# Patient Record
Sex: Female | Born: 1965
Health system: Southern US, Community
[De-identification: ages and names within clinical notes are randomized; demographics above are authoritative.]

## PROBLEM LIST (undated history)

## (undated) DIAGNOSIS — D649 Anemia, unspecified: Secondary | ICD-10-CM

## (undated) DIAGNOSIS — R7301 Impaired fasting glucose: Secondary | ICD-10-CM

## (undated) DIAGNOSIS — I10 Essential (primary) hypertension: Secondary | ICD-10-CM

## (undated) DIAGNOSIS — E669 Obesity, unspecified: Secondary | ICD-10-CM

## (undated) DIAGNOSIS — M7989 Other specified soft tissue disorders: Secondary | ICD-10-CM

## (undated) DIAGNOSIS — N2581 Secondary hyperparathyroidism of renal origin: Secondary | ICD-10-CM

## (undated) DIAGNOSIS — E119 Type 2 diabetes mellitus without complications: Secondary | ICD-10-CM

## (undated) DIAGNOSIS — E559 Vitamin D deficiency, unspecified: Secondary | ICD-10-CM

## (undated) DIAGNOSIS — Z973 Presence of spectacles and contact lenses: Secondary | ICD-10-CM

## (undated) HISTORY — DX: Impaired fasting glucose: R73.01

## (undated) HISTORY — DX: Anemia, unspecified: D64.9

## (undated) HISTORY — DX: Obesity, unspecified: E66.9

## (undated) HISTORY — PX: BREAST BIOPSY: SHX20

## (undated) HISTORY — DX: Vitamin D deficiency, unspecified: E55.9

## (undated) HISTORY — DX: Essential (primary) hypertension: I10

## (undated) HISTORY — DX: Other specified soft tissue disorders: M79.89

## (undated) HISTORY — DX: Type 2 diabetes mellitus without complications: E11.9

## (undated) HISTORY — PX: REDUCTION MAMMAPLASTY: SUR839

## (undated) HISTORY — DX: Secondary hyperparathyroidism of renal origin: N25.81

## (undated) HISTORY — PX: TUBAL LIGATION: SHX77

---

## 2000-09-19 HISTORY — PX: GASTRIC BYPASS: SHX52

## 2008-02-04 ENCOUNTER — Ambulatory Visit: Payer: Self-pay | Admitting: Internal Medicine

## 2008-03-14 ENCOUNTER — Ambulatory Visit: Payer: Self-pay | Admitting: Internal Medicine

## 2008-03-18 LAB — CBC WITH DIFFERENTIAL/PLATELET
BASO%: 0 % (ref 0.0–2.0)
EOS%: 2.1 % (ref 0.0–7.0)
HGB: 12.5 g/dL (ref 11.6–15.9)
MCH: 27.7 pg (ref 26.0–34.0)
MCV: 83.8 fL (ref 81.0–101.0)
MONO%: 5.4 % (ref 0.0–13.0)
RBC: 4.5 10*6/uL (ref 3.70–5.32)
RDW: 28 % — ABNORMAL HIGH (ref 11.3–14.5)
lymph#: 1.5 10*3/uL (ref 0.9–3.3)

## 2008-03-18 LAB — IRON AND TIBC
Iron: 52 ug/dL (ref 42–145)
TIBC: 361 ug/dL (ref 250–470)
UIBC: 309 ug/dL

## 2008-06-09 ENCOUNTER — Ambulatory Visit: Payer: Self-pay | Admitting: Internal Medicine

## 2012-03-24 LAB — TSH: TSH: 1.17 u[IU]/mL (ref 0.41–5.90)

## 2012-03-24 LAB — LIPID PANEL
Cholesterol: 214 mg/dL — AB (ref 0–200)
HDL: 110 mg/dL — AB (ref 35–70)
LDL Cholesterol: 89 mg/dL

## 2012-03-24 LAB — BASIC METABOLIC PANEL
Potassium: 3.1 mmol/L — AB (ref 3.4–5.3)
Sodium: 139 mmol/L (ref 137–147)

## 2012-04-06 ENCOUNTER — Other Ambulatory Visit (HOSPITAL_BASED_OUTPATIENT_CLINIC_OR_DEPARTMENT_OTHER): Payer: Self-pay | Admitting: Family Medicine

## 2012-04-06 ENCOUNTER — Ambulatory Visit (HOSPITAL_BASED_OUTPATIENT_CLINIC_OR_DEPARTMENT_OTHER)
Admission: RE | Admit: 2012-04-06 | Discharge: 2012-04-06 | Disposition: A | Discharge status: Home / Self Care | Payer: 59 | Source: Ambulatory Visit | Attending: Family Medicine | Admitting: Family Medicine

## 2012-04-06 DIAGNOSIS — Z1231 Encounter for screening mammogram for malignant neoplasm of breast: Secondary | ICD-10-CM

## 2012-04-06 LAB — HM MAMMOGRAPHY: HM Mammogram: 0

## 2012-04-16 LAB — HM PAP SMEAR: HM Pap smear: NEGATIVE

## 2012-11-15 ENCOUNTER — Encounter: Payer: Self-pay | Admitting: *Deleted

## 2012-11-15 DIAGNOSIS — I10 Essential (primary) hypertension: Secondary | ICD-10-CM | POA: Insufficient documentation

## 2012-11-15 DIAGNOSIS — N2581 Secondary hyperparathyroidism of renal origin: Secondary | ICD-10-CM | POA: Insufficient documentation

## 2012-11-15 DIAGNOSIS — E559 Vitamin D deficiency, unspecified: Secondary | ICD-10-CM | POA: Insufficient documentation

## 2012-11-15 DIAGNOSIS — D649 Anemia, unspecified: Secondary | ICD-10-CM | POA: Insufficient documentation

## 2012-11-15 DIAGNOSIS — R7301 Impaired fasting glucose: Secondary | ICD-10-CM | POA: Insufficient documentation

## 2013-01-01 ENCOUNTER — Encounter: Payer: Self-pay | Admitting: *Deleted

## 2013-01-03 ENCOUNTER — Ambulatory Visit (INDEPENDENT_AMBULATORY_CARE_PROVIDER_SITE_OTHER): Payer: 59 | Admitting: Family Medicine

## 2013-01-03 ENCOUNTER — Encounter: Payer: Self-pay | Admitting: Family Medicine

## 2013-01-03 VITALS — BP 104/73 | HR 80 | Ht 61.0 in | Wt 168.0 lb

## 2013-01-03 DIAGNOSIS — F439 Reaction to severe stress, unspecified: Secondary | ICD-10-CM

## 2013-01-03 DIAGNOSIS — E669 Obesity, unspecified: Secondary | ICD-10-CM

## 2013-01-03 DIAGNOSIS — Z639 Problem related to primary support group, unspecified: Secondary | ICD-10-CM

## 2013-01-03 DIAGNOSIS — E119 Type 2 diabetes mellitus without complications: Secondary | ICD-10-CM

## 2013-01-03 DIAGNOSIS — I10 Essential (primary) hypertension: Secondary | ICD-10-CM

## 2013-01-03 MED ORDER — CANAGLIFLOZIN 300 MG PO TABS: 1.0000 | ORAL_TABLET | Freq: Every day | ORAL | Status: DC

## 2013-01-03 MED ORDER — PHENTERMINE-TOPIRAMATE ER 7.5-46 MG PO CP24: 1.0000 | ORAL_CAPSULE | Freq: Every day | ORAL | Status: DC

## 2013-01-03 MED ORDER — METFORMIN HCL 1000 MG PO TABS: 1000.0000 mg | ORAL_TABLET | Freq: Two times a day (BID) | ORAL | Status: DC

## 2013-01-03 NOTE — Progress Notes (Signed)
Subjective:     Patient ID: Heather Bryan, female   DOB: 1965-11-28, 47 y.o.   MRN: [id number]  HPI Heather Bryan is here today to discuss the conditions listed below.  1)  Type II DM:  She is currently taking a combination of Victoza, Invocana and metformin for her blood sugar.  She was recently informed that her insurance is no longer going to cover Victoza so she needs something else to replace it.     2)  Obesity:   She has been taking her Qsymia at least twice per week and has done well on it.  She has lost 14 lbs since starting on the Qsymia in December.    3)  Stress:  She continues to struggle with her youngest son Heather Bryan  He has been suspended from school and is being monitored by the court system for drug use.  He has to get drug testing every couple of weeks and has been clean for the past month.  Heather Bryan feels that now that he is 44 and he knows that he can go to jail that he may be starting to approach things a little more differently.    Review of Systems  Constitutional: Positive for appetite change (Qsymia has worked well for decreasing her appetite.  ). Negative for activity change.  Respiratory: Negative for chest tightness and shortness of breath.   Cardiovascular: Negative for chest pain, palpitations and leg swelling.  Endocrine: Negative for polydipsia, polyphagia and polyuria.  Neurological: Positive for light-headedness. Negative for weakness.  Psychiatric/Behavioral:       Her youngest son has had many issues which has caused her to have a lot of stress. He has been drug free for the past month which is helping to lessen her stress.     Past Medical History  Diagnosis Date  . Hypertension   . Anemia   . Secondary hyperparathyroidism   . Unspecified vitamin D deficiency   . Obesity, unspecified   . Type II diabetes mellitus    Family History  Problem Relation Age of Onset  . Diabetes Father   . Diabetes Maternal Aunt   . Diabetes Maternal Uncle   . Diabetes Paternal  Aunt   . Diabetes Paternal Uncle    History   Social History Narrative   Marital Status: Divorced   Children:  Sons (2) Bud/Demarco   Pets: No   Living Situation: Lives with her youngest son.     Occupation: Med Dietitian)    Education:  Associate's  Degree   Tobacco: Never smoker   Alcohol Use:  Rarely   Drug Use:  None   Diet:  Low Fat    Exercise:  Walking   Hobbies:  Reading, Cooking    [Tobacco: Never smoker]            Objective:   Physical Exam  Constitutional: She is oriented to person, place, and time. She appears well-nourished. No distress.  Eyes: Conjunctivae are normal. No scleral icterus.  Neck: Neck supple. No thyromegaly present.  Cardiovascular: Normal rate, regular rhythm and normal heart sounds.   Pulmonary/Chest: Effort normal and breath sounds normal.  Abdominal: She exhibits no distension. There is no tenderness.  Musculoskeletal: Normal range of motion.  Lymphadenopathy:    She has no cervical adenopathy.  Neurological: She is alert and oriented to person, place, and time.  Skin: Skin is warm and dry.  Psychiatric: She has a normal mood and affect. Her behavior is normal. Judgment  and thought content normal.       Assessment:     Type II Diabetes Obesity Hypertension     Plan:     1)   Since she has done so well on her diet, we are going to have her hold on a GLP-receptor agonist for now.  I assume that her insurance might cover Byetta vs Bydureon but we'll see how she does off of this medication as far as weight loss and sugar control are concerned.  We'll increase her Invokana from 100 mg to 300 mg and keep her on the metformin.  She will continue to work on diet and exercise. She was given an order to check her labs. She'll send [place] her results.      2)  Chelcie's  blood pressure is pretty low on 1/2 of the Diovan HCT (320/25). She is to monitor it and if it drops lower, she is to hold it.  She may be able to come off of it totally by  following the DASH diet.  If not, we may need to give her the 160/25 dosage to keep it in perfect range.    TIME 30 MINUTES:  MORE THAN 50 % OF TIME WAS INVOLVED IN COUNSELING.

## 2013-01-03 NOTE — Patient Instructions (Addendum)
1)  Blood Sugar - We're going to increase the Invokana to 300 mg and have you continue on the metformin.  Let's hold on the Bydureon (Victoza) for now.  We'll recheck in 3-6 your A1c.    2)  Blood Pressure - Continue on 1/2 of the Diovan as long as you don't feel dizzy/lightheaded.  If you do then hold.  If you notice your BP increases then we might consider a smaller dosage.    DASH Diet The DASH diet stands for "Dietary Approaches to Stop Hypertension." It is a healthy eating plan that has been shown to reduce high blood pressure (hypertension) in as little as 14 days, while also possibly providing other significant health benefits. These other health benefits include reducing the risk of breast cancer after menopause and reducing the risk of type 2 diabetes, heart disease, colon cancer, and stroke. Health benefits also include weight loss and slowing kidney failure in patients with chronic kidney disease.  DIET GUIDELINES  Limit salt (sodium). Your diet should contain less than 1500 mg of sodium daily.  Limit refined or processed carbohydrates. Your diet should include mostly whole grains. Desserts and added sugars should be used sparingly.  Include small amounts of heart-healthy fats. These types of fats include nuts, oils, and tub margarine. Limit saturated and trans fats. These fats have been shown to be harmful in the body. CHOOSING FOODS  The following food groups are based on a 2000 calorie diet. See your Registered Dietitian for individual calorie needs. Grains and Grain Products (6 to 8 servings daily)  Eat More Often: Whole-wheat bread, brown rice, whole-grain or wheat pasta, quinoa, popcorn without added fat or salt (air popped).  Eat Less Often: White bread, white pasta, white rice, cornbread. Vegetables (4 to 5 servings daily)  Eat More Often: Fresh, frozen, and canned vegetables. Vegetables may be raw, steamed, roasted, or grilled with a minimal amount of fat.  Eat Less  Often/Avoid: Creamed or fried vegetables. Vegetables in a cheese sauce. Fruit (4 to 5 servings daily)  Eat More Often: All fresh, canned (in natural juice), or frozen fruits. Dried fruits without added sugar. One hundred percent fruit juice ( cup [237 mL] daily).  Eat Less Often: Dried fruits with added sugar. Canned fruit in light or heavy syrup. [employer], Fish, and Poultry (2 servings or less daily. One serving is 3 to 4 oz [85-114 g]).  Eat More Often: Ninety percent or leaner ground beef, tenderloin, sirloin. Round cuts of beef, chicken breast, [place] breast. All fish. Grill, bake, or broil your meat. Nothing should be fried.  Eat Less Often/Avoid: Fatty cuts of meat, [place], or chicken leg, thigh, or wing. Fried cuts of meat or fish. Dairy (2 to 3 servings)  Eat More Often: Low-fat or fat-free milk, low-fat plain or light yogurt, reduced-fat or part-skim cheese.  Eat Less Often/Avoid: Milk (whole, 2%).Whole milk yogurt. Full-fat cheeses. Nuts, Seeds, and Legumes (4 to 5 servings per week)  Eat More Often: All without added salt.  Eat Less Often/Avoid: Salted nuts and seeds, canned beans with added salt. Fats and Sweets (limited)  Eat More Often: Vegetable oils, tub margarines without trans fats, sugar-free gelatin. Mayonnaise and salad dressings.  Eat Less Often/Avoid: Coconut oils, palm oils, butter, stick margarine, cream, half and half, cookies, candy, pie. FOR MORE INFORMATION The Dash Diet Eating Plan: www.dashdiet.org Document Released: 08/25/2011 Document Revised: 11/28/2011 Document Reviewed: 08/25/2011 [facility] Patient Information 2013 [place], [place].

## 2013-01-06 ENCOUNTER — Encounter: Payer: Self-pay | Admitting: Family Medicine

## 2013-01-06 DIAGNOSIS — F439 Reaction to severe stress, unspecified: Secondary | ICD-10-CM | POA: Insufficient documentation

## 2013-01-06 DIAGNOSIS — I10 Essential (primary) hypertension: Secondary | ICD-10-CM | POA: Insufficient documentation

## 2013-01-06 DIAGNOSIS — E119 Type 2 diabetes mellitus without complications: Secondary | ICD-10-CM | POA: Insufficient documentation

## 2013-01-07 ENCOUNTER — Encounter: Payer: Self-pay | Admitting: *Deleted

## 2013-01-10 ENCOUNTER — Other Ambulatory Visit: Payer: Self-pay | Admitting: Family Medicine

## 2013-01-11 LAB — COMPLETE METABOLIC PANEL WITH GFR
ALT: 10 U/L (ref 0–35)
AST: 16 U/L (ref 0–37)
Albumin: 4.4 g/dL (ref 3.5–5.2)
Alkaline Phosphatase: 57 U/L (ref 39–117)
BUN: 22 mg/dL (ref 6–23)
CO2: 33 mEq/L — ABNORMAL HIGH (ref 19–32)
Calcium: 10 mg/dL (ref 8.4–10.5)
Chloride: 99 mEq/L (ref 96–112)
Creat: 1.01 mg/dL (ref 0.50–1.10)
GFR, Est African American: 77 mL/min
GFR, Est Non African American: 67 mL/min
Glucose, Bld: 86 mg/dL (ref 70–99)
Potassium: 3.3 mEq/L — ABNORMAL LOW (ref 3.5–5.3)
Sodium: 140 mEq/L (ref 135–145)
Total Bilirubin: 0.5 mg/dL (ref 0.3–1.2)
Total Protein: 6.9 g/dL (ref 6.0–8.3)

## 2013-01-11 LAB — LIPID PANEL
Cholesterol: 178 mg/dL (ref 0–200)
HDL: 89 mg/dL (ref >39)
LDL Cholesterol: 74 mg/dL (ref 0–99)
Total CHOL/HDL Ratio: 2 Ratio
Triglycerides: 75 mg/dL (ref <150)
VLDL: 15 mg/dL (ref 0–40)

## 2013-01-11 LAB — HEMOGLOBIN A1C
Hgb A1c MFr Bld: 5.7 % — ABNORMAL HIGH (ref <5.7)
Mean Plasma Glucose: 117 mg/dL — ABNORMAL HIGH (ref <117)

## 2013-01-11 LAB — TSH: TSH: 0.712 u[IU]/mL (ref 0.350–4.500)

## 2013-01-11 LAB — VITAMIN D 25 HYDROXY (VIT D DEFICIENCY, FRACTURES): Vit D, 25-Hydroxy: 46 ng/mL (ref 30–89)

## 2013-01-12 LAB — MICROALBUMIN, URINE: Microalb, Ur: 0.5 mg/dL (ref 0.00–1.89)

## 2013-01-15 LAB — PTH, INTACT AND CALCIUM
Calcium, Total (PTH): 10.1 mg/dL (ref 8.4–10.5)
PTH: 141.7 pg/mL — ABNORMAL HIGH (ref 14.0–72.0)

## 2013-04-04 ENCOUNTER — Ambulatory Visit (INDEPENDENT_AMBULATORY_CARE_PROVIDER_SITE_OTHER): Payer: 59 | Admitting: Family Medicine

## 2013-04-04 ENCOUNTER — Encounter: Payer: Self-pay | Admitting: Family Medicine

## 2013-04-04 VITALS — BP 109/77 | HR 74 | Ht 61.0 in | Wt 164.0 lb

## 2013-04-04 DIAGNOSIS — N2581 Secondary hyperparathyroidism of renal origin: Secondary | ICD-10-CM

## 2013-04-04 DIAGNOSIS — E119 Type 2 diabetes mellitus without complications: Secondary | ICD-10-CM

## 2013-04-04 DIAGNOSIS — K649 Unspecified hemorrhoids: Secondary | ICD-10-CM

## 2013-04-04 DIAGNOSIS — E669 Obesity, unspecified: Secondary | ICD-10-CM

## 2013-04-04 LAB — POCT URINALYSIS DIPSTICK
Bilirubin, UA: NEGATIVE
Blood, UA: NEGATIVE
Glucose, UA: POSITIVE
Ketones, UA: NEGATIVE
Leukocytes, UA: NEGATIVE
Nitrite, UA: NEGATIVE
Protein, UA: NEGATIVE
Spec Grav, UA: 1.01
Urobilinogen, UA: NEGATIVE
pH, UA: 5

## 2013-04-04 MED ORDER — PHENTERMINE-TOPIRAMATE ER 7.5-46 MG PO CP24: 1.0000 | ORAL_CAPSULE | Freq: Every day | ORAL | Status: DC

## 2013-04-04 MED ORDER — CANAGLIFLOZIN 300 MG PO TABS: 1.0000 | ORAL_TABLET | Freq: Every day | ORAL | Status: DC

## 2013-04-04 NOTE — Progress Notes (Signed)
  Subjective:    Patient ID: Heather Bryan, female    DOB: [birth year], 47 y.o.   MRN: [id number]  HPI  Selena Batten is here today to discuss her lab results, get medication refills and the conditions listed below:     1)  IFG:  She has done well on the combination of Invokana and metformin and needs both medications refilled.   2)  Hemorrhoids:  Her GI doctor prescribed suppositories which have helped her.  She would like a refill on it.   3)  Weight:  She continues taking her Qsymia and has done well on it.     Review of Systems  Constitutional: Negative.   HENT: Negative.   Eyes: Negative.   Respiratory: Negative.   Cardiovascular: Negative.   Gastrointestinal: Negative.   Endocrine: Negative.   Genitourinary: Negative.   Musculoskeletal: Negative.   Skin: Negative.   Allergic/Immunologic: Negative.   Neurological: Negative.   Hematological: Negative.   Psychiatric/Behavioral: Negative.      Past Medical History  Diagnosis Date  . Hypertension   . Anemia   . Secondary hyperparathyroidism   . Unspecified vitamin D deficiency   . Obesity, unspecified   . Type II diabetes mellitus   . Impaired fasting glucose   . Swelling of limb      Family History  Problem Relation Age of Onset  . Diabetes Father   . Heart disease Father   . Hypertension Father   . Diabetes Maternal Aunt   . Diabetes Maternal Uncle   . Diabetes Paternal Aunt   . Diabetes Paternal Uncle   . Heart disease Mother   . Hypertension Mother      History   Social History Narrative   Marital Status: Divorced   Children:  Sons (2) Bud/Demarco   Pets: No   Living Situation: Lives with her youngest son.     Occupation: Med Dietitian)    Education:  Associate's  Degree   Tobacco: Never smoker   Alcohol Use:  Rarely   Drug Use:  None   Diet:  Low Fat    Exercise:  Walking   Hobbies:  Reading, Cooking    [Tobacco: Never smoker]              Objective:   Physical Exam   Constitutional: She appears well-nourished. No distress.  HENT:  Head: Normocephalic.  Eyes: No scleral icterus.  Neck: No thyromegaly present.  Cardiovascular: Normal rate, regular rhythm and normal heart sounds.   Pulmonary/Chest: Effort normal and breath sounds normal.  Abdominal: There is no tenderness.  Musculoskeletal: She exhibits no edema and no tenderness.  Neurological: She is alert.  Skin: Skin is warm and dry.  Psychiatric: She has a normal mood and affect. Her behavior is normal. Judgment and thought content normal.       Assessment & Plan:

## 2013-05-12 ENCOUNTER — Encounter: Payer: Self-pay | Admitting: Family Medicine

## 2013-05-12 DIAGNOSIS — N2581 Secondary hyperparathyroidism of renal origin: Secondary | ICD-10-CM | POA: Insufficient documentation

## 2013-05-12 DIAGNOSIS — K649 Unspecified hemorrhoids: Secondary | ICD-10-CM | POA: Insufficient documentation

## 2013-05-12 DIAGNOSIS — R7301 Impaired fasting glucose: Secondary | ICD-10-CM | POA: Insufficient documentation

## 2013-05-12 NOTE — Assessment & Plan Note (Signed)
Refilled her Invokana

## 2013-05-12 NOTE — Assessment & Plan Note (Signed)
Refilled her Qsymia.

## 2013-05-12 NOTE — Assessment & Plan Note (Addendum)
Her PTH remains elevated.  Her lab reports in the past showed that her picture was consistent with secondary hyperparathyroidism.  I will send her to an endocrinologist to see if she should have a hyperthyroid scan.

## 2013-05-12 NOTE — Assessment & Plan Note (Deleted)
Refilled her Invokana 

## 2013-05-12 NOTE — Assessment & Plan Note (Signed)
Refilled her hemorrhoidal cream.

## 2013-05-14 ENCOUNTER — Telehealth: Payer: Self-pay | Admitting: *Deleted

## 2013-05-14 NOTE — Telephone Encounter (Signed)
We faxed last office notes to Dr Corwin Levins (Endocrinologist).  After her records are reviewed his staff will contact Heather Bryan for an appointment.   Left a voice mail to Heather Bryan notifying her she will be contacted by Dr Michaelle Copas office soon. PG

## 2013-05-22 ENCOUNTER — Other Ambulatory Visit: Payer: Self-pay | Admitting: Endocrinology

## 2013-05-22 DIAGNOSIS — E213 Hyperparathyroidism, unspecified: Secondary | ICD-10-CM

## 2013-06-04 ENCOUNTER — Other Ambulatory Visit: Payer: 59

## 2013-06-05 ENCOUNTER — Ambulatory Visit
Admission: RE | Admit: 2013-06-05 | Discharge: 2013-06-05 | Disposition: A | Discharge status: Home / Self Care | Payer: 59 | Source: Ambulatory Visit | Attending: Endocrinology | Admitting: Endocrinology

## 2013-06-05 DIAGNOSIS — E213 Hyperparathyroidism, unspecified: Secondary | ICD-10-CM

## 2013-08-09 ENCOUNTER — Encounter: Payer: Self-pay | Admitting: *Deleted

## 2013-10-11 ENCOUNTER — Other Ambulatory Visit: Payer: Self-pay | Admitting: Family Medicine

## 2013-10-11 DIAGNOSIS — Z1231 Encounter for screening mammogram for malignant neoplasm of breast: Secondary | ICD-10-CM

## 2013-10-18 ENCOUNTER — Ambulatory Visit (HOSPITAL_BASED_OUTPATIENT_CLINIC_OR_DEPARTMENT_OTHER): Payer: 59

## 2013-10-23 ENCOUNTER — Ambulatory Visit (HOSPITAL_BASED_OUTPATIENT_CLINIC_OR_DEPARTMENT_OTHER)
Admission: RE | Admit: 2013-10-23 | Discharge: 2013-10-23 | Disposition: A | Discharge status: Home / Self Care | Payer: 59 | Source: Ambulatory Visit | Attending: Family Medicine | Admitting: Family Medicine

## 2013-10-23 DIAGNOSIS — Z1231 Encounter for screening mammogram for malignant neoplasm of breast: Secondary | ICD-10-CM | POA: Insufficient documentation

## 2013-10-31 ENCOUNTER — Other Ambulatory Visit: Payer: Self-pay | Admitting: Family Medicine

## 2013-10-31 DIAGNOSIS — R928 Other abnormal and inconclusive findings on diagnostic imaging of breast: Secondary | ICD-10-CM

## 2013-11-15 ENCOUNTER — Other Ambulatory Visit: Payer: 59

## 2013-11-19 ENCOUNTER — Other Ambulatory Visit: Payer: Self-pay | Admitting: *Deleted

## 2013-11-19 ENCOUNTER — Ambulatory Visit
Admission: RE | Admit: 2013-11-19 | Discharge: 2013-11-19 | Disposition: A | Discharge status: Home / Self Care | Payer: 59 | Source: Ambulatory Visit | Attending: Family Medicine | Admitting: Family Medicine

## 2013-11-19 ENCOUNTER — Other Ambulatory Visit: Payer: Self-pay | Admitting: Family Medicine

## 2013-11-19 DIAGNOSIS — E213 Hyperparathyroidism, unspecified: Secondary | ICD-10-CM

## 2013-11-19 DIAGNOSIS — R928 Other abnormal and inconclusive findings on diagnostic imaging of breast: Secondary | ICD-10-CM

## 2013-11-19 DIAGNOSIS — E119 Type 2 diabetes mellitus without complications: Secondary | ICD-10-CM

## 2013-11-19 DIAGNOSIS — R5383 Other fatigue: Secondary | ICD-10-CM

## 2013-11-19 DIAGNOSIS — R5381 Other malaise: Secondary | ICD-10-CM

## 2013-11-20 ENCOUNTER — Other Ambulatory Visit: Payer: 59

## 2013-11-26 ENCOUNTER — Other Ambulatory Visit: Payer: Self-pay | Admitting: Family Medicine

## 2013-11-26 ENCOUNTER — Ambulatory Visit
Admission: RE | Admit: 2013-11-26 | Discharge: 2013-11-26 | Disposition: A | Discharge status: Home / Self Care | Payer: 59 | Source: Ambulatory Visit | Attending: Family Medicine | Admitting: Family Medicine

## 2013-11-26 DIAGNOSIS — N6320 Unspecified lump in the left breast, unspecified quadrant: Secondary | ICD-10-CM

## 2013-12-06 LAB — CBC WITH DIFFERENTIAL/PLATELET

## 2013-12-06 LAB — COMPLETE METABOLIC PANEL WITH GFR
ALT: <8 U/L (ref 0–35)
AST: 18 U/L (ref 0–37)
Albumin: 4.7 g/dL (ref 3.5–5.2)
Alkaline Phosphatase: 70 U/L (ref 39–117)
BUN: 21 mg/dL (ref 6–23)
CO2: 29 mEq/L (ref 19–32)
Calcium: 10.3 mg/dL (ref 8.4–10.5)
Chloride: 101 mEq/L (ref 96–112)
Creat: 1.04 mg/dL (ref 0.50–1.10)
GFR, Est African American: 74 mL/min
GFR, Est Non African American: 64 mL/min
Glucose, Bld: 89 mg/dL (ref 70–99)
Potassium: 3.9 mEq/L (ref 3.5–5.3)
Sodium: 140 mEq/L (ref 135–145)
Total Bilirubin: 0.3 mg/dL (ref 0.2–1.2)
Total Protein: 7.2 g/dL (ref 6.0–8.3)

## 2013-12-06 LAB — PTH, INTACT AND CALCIUM
Calcium: 10.3 mg/dL (ref 8.4–10.5)
PTH: 239.7 pg/mL — ABNORMAL HIGH (ref 14.0–72.0)

## 2013-12-07 LAB — HEMOGLOBIN A1C
Hgb A1c MFr Bld: 5.7 % — ABNORMAL HIGH (ref <5.7)
Mean Plasma Glucose: 117 mg/dL — ABNORMAL HIGH (ref <117)

## 2013-12-09 ENCOUNTER — Encounter: Payer: Self-pay | Admitting: Family Medicine

## 2013-12-09 ENCOUNTER — Ambulatory Visit: Payer: 59 | Admitting: Family Medicine

## 2013-12-09 ENCOUNTER — Ambulatory Visit (INDEPENDENT_AMBULATORY_CARE_PROVIDER_SITE_OTHER): Payer: 59 | Admitting: Family Medicine

## 2013-12-09 ENCOUNTER — Encounter (INDEPENDENT_AMBULATORY_CARE_PROVIDER_SITE_OTHER): Payer: Self-pay

## 2013-12-09 VITALS — BP 112/76 | HR 70 | Resp 16 | Ht 61.0 in | Wt 157.0 lb

## 2013-12-09 DIAGNOSIS — E669 Obesity, unspecified: Secondary | ICD-10-CM

## 2013-12-09 DIAGNOSIS — K649 Unspecified hemorrhoids: Secondary | ICD-10-CM

## 2013-12-09 DIAGNOSIS — Z01818 Encounter for other preprocedural examination: Secondary | ICD-10-CM

## 2013-12-09 DIAGNOSIS — E559 Vitamin D deficiency, unspecified: Secondary | ICD-10-CM

## 2013-12-09 DIAGNOSIS — M7989 Other specified soft tissue disorders: Secondary | ICD-10-CM

## 2013-12-09 DIAGNOSIS — E119 Type 2 diabetes mellitus without complications: Secondary | ICD-10-CM

## 2013-12-09 DIAGNOSIS — I10 Essential (primary) hypertension: Secondary | ICD-10-CM

## 2013-12-09 MED ORDER — PHENTERMINE-TOPIRAMATE ER 7.5-46 MG PO CP24
1.0000 | ORAL_CAPSULE | Freq: Every day | ORAL | Status: AC
Start: 2013-12-09 — End: 2014-12-09

## 2013-12-09 MED ORDER — VITAMIN D (ERGOCALCIFEROL) 1.25 MG (50000 UNIT) PO CAPS: 50000.0000 [IU] | ORAL_CAPSULE | ORAL | Status: DC

## 2013-12-09 MED ORDER — FUROSEMIDE 40 MG PO TABS: ORAL_TABLET | ORAL | Status: DC

## 2013-12-09 MED ORDER — METFORMIN HCL 1000 MG PO TABS: 1000.0000 mg | ORAL_TABLET | Freq: Two times a day (BID) | ORAL | Status: DC

## 2013-12-09 MED ORDER — CANAGLIFLOZIN 300 MG PO TABS: 1.0000 | ORAL_TABLET | Freq: Every day | ORAL | Status: AC

## 2013-12-09 MED ORDER — VALSARTAN-HYDROCHLOROTHIAZIDE 320-25 MG PO TABS: 1.0000 | ORAL_TABLET | Freq: Every day | ORAL | Status: DC

## 2013-12-11 ENCOUNTER — Encounter (HOSPITAL_BASED_OUTPATIENT_CLINIC_OR_DEPARTMENT_OTHER): Payer: Self-pay | Admitting: *Deleted

## 2013-12-11 MED ORDER — HYDROCORTISONE ACETATE 25 MG RE SUPP: 25.0000 mg | Freq: Two times a day (BID) | RECTAL | Status: AC | PRN

## 2013-12-11 NOTE — Progress Notes (Signed)
Pt works [employer]=-3rd shift-is working night before surgery-told her to hydrate well-bring all meds-do not take any diabetes meds am surgery

## 2013-12-17 ENCOUNTER — Encounter (HOSPITAL_BASED_OUTPATIENT_CLINIC_OR_DEPARTMENT_OTHER): Payer: Self-pay | Admitting: Certified Registered"

## 2013-12-17 ENCOUNTER — Ambulatory Visit (HOSPITAL_BASED_OUTPATIENT_CLINIC_OR_DEPARTMENT_OTHER)
Admission: RE | Admit: 2013-12-17 | Discharge: 2013-12-17 | Disposition: A | Discharge status: Home / Self Care | Payer: 59 | Source: Ambulatory Visit | Attending: Plastic Surgery | Admitting: Plastic Surgery

## 2013-12-17 ENCOUNTER — Ambulatory Visit (HOSPITAL_BASED_OUTPATIENT_CLINIC_OR_DEPARTMENT_OTHER): Payer: 59 | Admitting: Certified Registered"

## 2013-12-17 ENCOUNTER — Encounter (HOSPITAL_BASED_OUTPATIENT_CLINIC_OR_DEPARTMENT_OTHER)
Admission: RE | Disposition: A | Discharge status: Home / Self Care | Payer: Self-pay | Source: Ambulatory Visit | Attending: Plastic Surgery

## 2013-12-17 ENCOUNTER — Encounter (HOSPITAL_BASED_OUTPATIENT_CLINIC_OR_DEPARTMENT_OTHER): Payer: 59 | Admitting: Certified Registered"

## 2013-12-17 DIAGNOSIS — N62 Hypertrophy of breast: Secondary | ICD-10-CM | POA: Insufficient documentation

## 2013-12-17 DIAGNOSIS — E119 Type 2 diabetes mellitus without complications: Secondary | ICD-10-CM | POA: Insufficient documentation

## 2013-12-17 DIAGNOSIS — Z9884 Bariatric surgery status: Secondary | ICD-10-CM | POA: Insufficient documentation

## 2013-12-17 DIAGNOSIS — I10 Essential (primary) hypertension: Secondary | ICD-10-CM | POA: Insufficient documentation

## 2013-12-17 HISTORY — DX: Presence of spectacles and contact lenses: Z97.3

## 2013-12-17 HISTORY — PX: BREAST REDUCTION SURGERY: SHX8

## 2013-12-17 HISTORY — PX: BREAST SURGERY: SHX581

## 2013-12-17 LAB — POCT HEMOGLOBIN-HEMACUE: Hemoglobin: 12.8 g/dL (ref 12.0–15.0)

## 2013-12-17 LAB — GLUCOSE, CAPILLARY
GLUCOSE-CAPILLARY: 75 mg/dL (ref 70–99)
Glucose-Capillary: 138 mg/dL — ABNORMAL HIGH (ref 70–99)
Glucose-Capillary: 152 mg/dL — ABNORMAL HIGH (ref 70–99)

## 2013-12-17 SURGERY — MAMMOPLASTY, REDUCTION
Anesthesia: General | Site: Breast | Laterality: Bilateral

## 2013-12-17 MED ORDER — ONDANSETRON HCL 4 MG/2ML IJ SOLN
INTRAMUSCULAR | Status: DC | PRN
  Administered 2013-12-17: 4 mg via INTRAVENOUS

## 2013-12-17 MED ORDER — MIDAZOLAM HCL 2 MG/2ML IJ SOLN
INTRAMUSCULAR | Status: AC
  Filled 2013-12-17: qty 2

## 2013-12-17 MED ORDER — LIDOCAINE-EPINEPHRINE 1 %-1:100000 IJ SOLN
INTRAMUSCULAR | Status: DC | PRN
  Administered 2013-12-17: 40 mL

## 2013-12-17 MED ORDER — BUPIVACAINE LIPOSOME 1.3 % IJ SUSP
INTRAMUSCULAR | Status: AC
  Filled 2013-12-17: qty 40

## 2013-12-17 MED ORDER — CEFAZOLIN SODIUM-DEXTROSE 2-3 GM-% IV SOLR
INTRAVENOUS | Status: AC
  Filled 2013-12-17: qty 50

## 2013-12-17 MED ORDER — MIDAZOLAM HCL 2 MG/2ML IJ SOLN: 1.0000 mg | INTRAMUSCULAR | Status: DC | PRN

## 2013-12-17 MED ORDER — FENTANYL CITRATE 0.05 MG/ML IJ SOLN
INTRAMUSCULAR | Status: DC | PRN
  Administered 2013-12-17 (×2): 50 ug via INTRAVENOUS
  Administered 2013-12-17 (×3): 100 ug via INTRAVENOUS
  Administered 2013-12-17 (×3): 50 ug via INTRAVENOUS

## 2013-12-17 MED ORDER — BUPIVACAINE LIPOSOME 1.3 % IJ SUSP
INTRAMUSCULAR | Status: DC | PRN
  Administered 2013-12-17: 20 mL

## 2013-12-17 MED ORDER — OXYCODONE HCL 5 MG PO TABS
ORAL_TABLET | ORAL | Status: AC
  Filled 2013-12-17: qty 1

## 2013-12-17 MED ORDER — HYDROMORPHONE HCL PF 1 MG/ML IJ SOLN
0.2500 mg | INTRAMUSCULAR | Status: DC | PRN
  Administered 2013-12-17 (×2): 0.5 mg via INTRAVENOUS

## 2013-12-17 MED ORDER — OXYCODONE HCL 5 MG PO TABS
5.0000 mg | ORAL_TABLET | Freq: Once | ORAL | Status: AC | PRN
  Administered 2013-12-17: 5 mg via ORAL

## 2013-12-17 MED ORDER — FENTANYL CITRATE 0.05 MG/ML IJ SOLN: 50.0000 ug | INTRAMUSCULAR | Status: DC | PRN

## 2013-12-17 MED ORDER — LIDOCAINE HCL 1 % IJ SOLN
INTRAVENOUS | Status: DC | PRN
  Administered 2013-12-17: 10:00:00

## 2013-12-17 MED ORDER — MEPERIDINE HCL 25 MG/ML IJ SOLN: 6.2500 mg | INTRAMUSCULAR | Status: DC | PRN

## 2013-12-17 MED ORDER — PROPOFOL 10 MG/ML IV EMUL
INTRAVENOUS | Status: AC
  Filled 2013-12-17: qty 50

## 2013-12-17 MED ORDER — LIDOCAINE HCL (CARDIAC) 20 MG/ML IV SOLN
INTRAVENOUS | Status: DC | PRN
  Administered 2013-12-17: 60 mg via INTRAVENOUS

## 2013-12-17 MED ORDER — LIDOCAINE HCL 1 % IJ SOLN
INTRAVENOUS | Status: DC | PRN
  Administered 2013-12-17: 09:00:00

## 2013-12-17 MED ORDER — SODIUM CHLORIDE 0.9 % IJ SOLN
INTRAMUSCULAR | Status: AC
Start: 2013-12-17 — End: 2013-12-17
  Filled 2013-12-17: qty 40

## 2013-12-17 MED ORDER — BACITRACIN ZINC 500 UNIT/GM EX OINT
TOPICAL_OINTMENT | CUTANEOUS | Status: AC
  Filled 2013-12-17: qty 28.35

## 2013-12-17 MED ORDER — EPHEDRINE SULFATE 50 MG/ML IJ SOLN
INTRAMUSCULAR | Status: DC | PRN
  Administered 2013-12-17 (×4): 10 mg via INTRAVENOUS
  Administered 2013-12-17 (×2): 5 mg via INTRAVENOUS
  Administered 2013-12-17: 10 mg via INTRAVENOUS

## 2013-12-17 MED ORDER — FENTANYL CITRATE 0.05 MG/ML IJ SOLN
INTRAMUSCULAR | Status: AC
  Filled 2013-12-17: qty 12

## 2013-12-17 MED ORDER — BACITRACIN ZINC 500 UNIT/GM EX OINT
TOPICAL_OINTMENT | CUTANEOUS | Status: DC | PRN
  Administered 2013-12-17: 1 via TOPICAL

## 2013-12-17 MED ORDER — EPINEPHRINE HCL 1 MG/ML IJ SOLN
INTRAMUSCULAR | Status: AC
  Filled 2013-12-17: qty 1

## 2013-12-17 MED ORDER — MIDAZOLAM HCL 2 MG/2ML IJ SOLN: 0.5000 mg | Freq: Once | INTRAMUSCULAR | Status: DC | PRN

## 2013-12-17 MED ORDER — CEFAZOLIN SODIUM 1-5 GM-% IV SOLN
1.0000 g | Freq: Once | INTRAVENOUS | Status: AC
  Administered 2013-12-17: 2 g via INTRAVENOUS

## 2013-12-17 MED ORDER — LACTATED RINGERS IV SOLN
INTRAVENOUS | Status: DC
  Administered 2013-12-17 (×4): via INTRAVENOUS

## 2013-12-17 MED ORDER — MIDAZOLAM HCL 5 MG/5ML IJ SOLN
INTRAMUSCULAR | Status: DC | PRN
  Administered 2013-12-17: 2 mg via INTRAVENOUS

## 2013-12-17 MED ORDER — PROMETHAZINE HCL 25 MG/ML IJ SOLN: 6.2500 mg | INTRAMUSCULAR | Status: DC | PRN

## 2013-12-17 MED ORDER — DEXAMETHASONE SODIUM PHOSPHATE 4 MG/ML IJ SOLN
INTRAMUSCULAR | Status: DC | PRN
Start: 2013-12-17 — End: 2013-12-17
  Administered 2013-12-17: 4 mg via INTRAVENOUS

## 2013-12-17 MED ORDER — SUCCINYLCHOLINE CHLORIDE 20 MG/ML IJ SOLN
INTRAMUSCULAR | Status: DC | PRN
Start: 2013-12-17 — End: 2013-12-17
  Administered 2013-12-17: 100 mg via INTRAVENOUS

## 2013-12-17 MED ORDER — LIDOCAINE-EPINEPHRINE 1 %-1:100000 IJ SOLN
INTRAMUSCULAR | Status: AC
  Filled 2013-12-17: qty 2

## 2013-12-17 MED ORDER — LIDOCAINE HCL (PF) 1 % IJ SOLN
INTRAMUSCULAR | Status: AC
  Filled 2013-12-17: qty 30

## 2013-12-17 MED ORDER — OXYCODONE HCL 5 MG/5ML PO SOLN: 5.0000 mg | Freq: Once | ORAL | Status: AC | PRN

## 2013-12-17 MED ORDER — HYDROMORPHONE HCL PF 1 MG/ML IJ SOLN
INTRAMUSCULAR | Status: AC
  Filled 2013-12-17: qty 1

## 2013-12-17 MED ORDER — PROPOFOL 10 MG/ML IV BOLUS
INTRAVENOUS | Status: DC | PRN
Start: 2013-12-17 — End: 2013-12-17
  Administered 2013-12-17: 200 mg via INTRAVENOUS
  Administered 2013-12-17: 50 mg via INTRAVENOUS

## 2013-12-17 SURGICAL SUPPLY — 67 items
BAG DECANTER FOR FLEXI CONT (MISCELLANEOUS) ×6 IMPLANT
BENZOIN TINCTURE PRP APPL 2/3 (GAUZE/BANDAGES/DRESSINGS) ×6 IMPLANT
BLADE KNIFE PERSONA 10 (BLADE) ×12 IMPLANT
BLADE KNIFE PERSONA 15 (BLADE) ×9 IMPLANT
BNDG GAUZE ELAST 4 BULKY (GAUZE/BANDAGES/DRESSINGS) ×6 IMPLANT
CANISTER SUCT 1200ML W/VALVE (MISCELLANEOUS) ×6 IMPLANT
CAP BOUFFANT 24 BLUE NURSES (PROTECTIVE WEAR) ×3 IMPLANT
CLOSURE WOUND 1/2 X4 (GAUZE/BANDAGES/DRESSINGS) ×4
COVER MAYO STAND STRL (DRAPES) ×3 IMPLANT
COVER TABLE BACK 60X90 (DRAPES) ×3 IMPLANT
DECANTER SPIKE VIAL GLASS SM (MISCELLANEOUS) IMPLANT
DRAIN CHANNEL 10F 3/8 F FF (DRAIN) ×6 IMPLANT
DRAPE LAPAROSCOPIC ABDOMINAL (DRAPES) ×3 IMPLANT
DRSG EMULSION OIL 3X3 NADH (GAUZE/BANDAGES/DRESSINGS) ×6 IMPLANT
DRSG PAD ABDOMINAL 8X10 ST (GAUZE/BANDAGES/DRESSINGS) ×12 IMPLANT
ELECT REM PT RETURN 9FT ADLT (ELECTROSURGICAL) ×3
ELECTRODE REM PT RTRN 9FT ADLT (ELECTROSURGICAL) ×1 IMPLANT
EVACUATOR SILICONE 100CC (DRAIN) ×6 IMPLANT
FILTER 7/8 IN (FILTER) ×3 IMPLANT
GLOVE BIO SURGEON STRL SZ7 (GLOVE) ×6 IMPLANT
GLOVE BIOGEL PI IND STRL 7.0 (GLOVE) ×2 IMPLANT
GLOVE BIOGEL PI INDICATOR 7.0 (GLOVE) ×4
GLOVE ECLIPSE 6.5 STRL STRAW (GLOVE) ×12 IMPLANT
GOWN STRL REUS W/ TWL LRG LVL3 (GOWN DISPOSABLE) ×3 IMPLANT
GOWN STRL REUS W/ TWL XL LVL3 (GOWN DISPOSABLE) ×1 IMPLANT
GOWN STRL REUS W/TWL LRG LVL3 (GOWN DISPOSABLE) ×6
GOWN STRL REUS W/TWL XL LVL3 (GOWN DISPOSABLE) ×2
LINER CANISTER 1000CC FLEX (MISCELLANEOUS) ×3 IMPLANT
NDL SAFETY ECLIPSE 18X1.5 (NEEDLE) ×2 IMPLANT
NEEDLE HYPO 18GX1.5 SHARP (NEEDLE) ×4
NEEDLE HYPO 25X1 1.5 SAFETY (NEEDLE) ×6 IMPLANT
NEEDLE SPNL 18GX3.5 QUINCKE PK (NEEDLE) ×3 IMPLANT
NS IRRIG 1000ML POUR BTL (IV SOLUTION) ×6 IMPLANT
PACK BASIN DAY SURGERY FS (CUSTOM PROCEDURE TRAY) ×3 IMPLANT
PIN SAFETY STERILE (MISCELLANEOUS) ×3 IMPLANT
SCRUB PCMX 4 OZ (MISCELLANEOUS) ×3 IMPLANT
SCRUB TECHNI CARE SURGICAL (MISCELLANEOUS) IMPLANT
SHEET MEDIUM DRAPE 40X70 STRL (DRAPES) ×6 IMPLANT
SLEEVE SCD COMPRESS KNEE MED (MISCELLANEOUS) ×3 IMPLANT
SPECIMEN JAR MEDIUM (MISCELLANEOUS) ×6 IMPLANT
SPECIMEN JAR X LARGE (MISCELLANEOUS) IMPLANT
SPONGE GAUZE 4X4 12PLY (GAUZE/BANDAGES/DRESSINGS) ×6 IMPLANT
SPONGE LAP 18X18 X RAY DECT (DISPOSABLE) ×9 IMPLANT
STAPLER VISISTAT 35W (STAPLE) ×6 IMPLANT
STRIP CLOSURE SKIN 1/2X4 (GAUZE/BANDAGES/DRESSINGS) ×8 IMPLANT
SUT ETHILON 3 0 PS 1 (SUTURE) ×3 IMPLANT
SUT MNCRL AB 3-0 PS2 18 (SUTURE) ×15 IMPLANT
SUT MNCRL AB 4-0 PS2 18 (SUTURE) ×6 IMPLANT
SUT MON AB 5-0 PS2 18 (SUTURE) ×12 IMPLANT
SUT PROLENE 2 0 CT2 30 (SUTURE) ×3 IMPLANT
SUT PROLENE 3 0 PS 1 (SUTURE) ×6 IMPLANT
SUT QUILL PDO 2-0 (SUTURE) ×6 IMPLANT
SYR 20CC LL (SYRINGE) ×3 IMPLANT
SYR 50ML LL SCALE MARK (SYRINGE) ×6 IMPLANT
SYR 5ML LL (SYRINGE) ×3 IMPLANT
SYR BULB IRRIGATION 50ML (SYRINGE) ×6 IMPLANT
SYR CONTROL 10ML LL (SYRINGE) ×9 IMPLANT
TOWEL OR 17X24 6PK STRL BLUE (TOWEL DISPOSABLE) ×9 IMPLANT
TOWEL OR NON WOVEN STRL DISP B (DISPOSABLE) IMPLANT
TRAY DSU PREP LF (CUSTOM PROCEDURE TRAY) ×3 IMPLANT
TRAY FOLEY CATH 14FR (SET/KITS/TRAYS/PACK) ×3 IMPLANT
TUBE CONNECTING 20'X1/4 (TUBING) ×1
TUBE CONNECTING 20X1/4 (TUBING) ×2 IMPLANT
TUBING SET GRADUATE ASPIR 12FT (MISCELLANEOUS) ×3 IMPLANT
UNDERPAD 30X30 INCONTINENT (UNDERPADS AND DIAPERS) ×6 IMPLANT
VAC PENCILS W/TUBING CLEAR (MISCELLANEOUS) ×3 IMPLANT
YANKAUER SUCT BULB TIP NO VENT (SUCTIONS) ×3 IMPLANT

## 2013-12-17 NOTE — Brief Op Note (Signed)
12/17/2013  12:18 PM  PATIENT:  Elayne GuerinKim Wilder  48 y.o. female  PRE-OPERATIVE DIAGNOSIS:  Hypertrophy of breast  POST-OPERATIVE DIAGNOSIS:  Hypertrophy of breast  PROCEDURE:  Bilateral Reduction Mammaplasties  SURGEON:  Surgeon(s) and Role:    * Mary A Contogiannis, MD - Primary  ANESTHESIA:   general  EBL:  Total I/O In: 3000 [I.V.:3000] Out: 1050 [Urine:800; Blood:250]  BLOOD ADMINISTERED:none  DRAINS: (35F) Jackson-Pratt drain(s) with closed bulb suction in the Bilateral breasts   LOCAL MEDICATIONS USED:  OTHER 1.3% Exparel (total 266 mgs.)  SPECIMEN:  Source of Specimen:  Bilateral Breasts  DISPOSITION OF SPECIMEN:  PATHOLOGY  COUNTS:  YES  DICTATION: .Other Dictation: Dictation Number [id number]  PLAN OF CARE: Discharge to home after PACU  PATIENT DISPOSITION:  PACU - hemodynamically stable.   Delay start of Pharmacological VTE agent (>24hrs) due to surgical blood loss or risk of bleeding: not applicable

## 2013-12-17 NOTE — Discharge Instructions (Signed)
1. No lifting greater than 5 lbs with arms for 4 weeks. 2. Empty, strip, record and reactivate JP drains 3 times a day. 3. Percocet 5/325 mg tabs 1-2 tabs po q 4-6 hours prn pain- prescription given in office. 4. Duricef 1 tab po bid- prescription given in office. 5. Sterapred dose pack as directed- prescription given in office. 6. Follow-up appointment Thursday in office.   Post Anesthesia Home Care Instructions  Activity: Get plenty of rest for the remainder of the day. A responsible adult should stay with you for 24 hours following the procedure.  For the next 24 hours, DO NOT: -Drive a car -Advertising copywriterperate machinery -Drink alcoholic beverages -Take any medication unless instructed by your physician -Make any legal decisions or sign important papers.  Meals: Start with liquid foods such as gelatin or soup. Progress to regular foods as tolerated. Avoid greasy, spicy, heavy foods. If nausea and/or vomiting occur, drink only clear liquids until the nausea and/or vomiting subsides. Call your physician if vomiting continues.  Special Instructions/Symptoms: Your throat may feel dry or sore from the anesthesia or the breathing tube placed in your throat during surgery. If this causes discomfort, gargle with warm salt water. The discomfort should disappear within 24 hours.  About my Jackson-Pratt Bulb Drain  What is a Jackson-Pratt bulb? A Jackson-Pratt is a soft, round device used to collect drainage. It is connected to a long, thin drainage catheter, which is held in place by one or two small stiches near your surgical incision site. When the bulb is squeezed, it forms a vacuum, forcing the drainage to empty into the bulb.  Emptying the Jackson-Pratt bulb- To empty the bulb: 1. Release the plug on the top of the bulb. 2. Pour the bulb's contents into a measuring container which your nurse will provide. 3. Record the time emptied and amount of drainage. Empty the drain(s) as often as your      doctor or nurse recommends.  Date                  Time                    Amount (Drain 1)                 Amount (Drain 2)  _____________________________________________________________________  _____________________________________________________________________  _____________________________________________________________________  _____________________________________________________________________  _____________________________________________________________________  _____________________________________________________________________  _____________________________________________________________________  _____________________________________________________________________  Squeezing the Jackson-Pratt Bulb- To squeeze the bulb: 1. Make sure the plug at the top of the bulb is open. 2. Squeeze the bulb tightly in your fist. You will hear air squeezing from the bulb. 3. Replace the plug while the bulb is squeezed. 4. Use a safety pin to attach the bulb to your clothing. This will keep the catheter from     pulling at the bulb insertion site.  When to call your doctor- Call your doctor if:  Drain site becomes red, swollen or hot.  You have a fever greater than 101 degrees F.  There is oozing at the drain site.  Drain falls out (apply a guaze bandage over the drain hole and secure it with tape).  Drainage increases daily not related to activity patterns. (You will usually have more drainage when you are active than when you are resting.)  Drainage has a bad odor.   SACRAL DRESSING (Lower Back)    A pressure ulcer is a sore where the skin breaks open    This dressing will be placed on  your lower back to protect this area from pressure and moisture and in many cases helps prevent pressure ulcers from forming    A nurse may place this dressing before your surgery or another procedure    A nurse may also place this dressing if you have other conditions that put  you at risk for developing a pressure ulcer    If you are getting up and moving around after surgery, the dressing may be taken off with your first shower. Simply remove it and throw it away.    While you are in the hospital, nurses will change the dressing twice a week as long as you are still at risk for developing a pressure ulcer    This dressing is latex free and made with silicone (for adhesive sensitivity) so it is safe and gentle to the skin   Information for Discharge Teaching: EXPAREL (bupivacaine liposome injectable suspension)   Your surgeon gave you EXPAREL(bupivacaine) in your surgical incision to help control your pain after surgery.   EXPAREL is a local anesthetic that provides pain relief by numbing the tissue around the surgical site.  EXPAREL is designed to release pain medication over time and can control pain for up to 72 hours.  Depending on how you respond to EXPAREL, you may require less pain medication during your recovery.  Possible side effects:  Temporary loss of sensation or ability to move in the area where bupivacaine was injected.  Nausea, vomiting, constipation  Rarely, numbness and tingling in your mouth or lips, lightheadedness, or anxiety may occur.  Call your doctor right away if you think you may be experiencing any of these sensations, or if you have other questions regarding possible side effects.  Follow all other discharge instructions given to you by your surgeon or nurse. Eat a healthy diet and drink plenty of water or other fluids.  If you return to the hospital for any reason within 96 hours following the administration of EXPAREL, please inform your health care providers.

## 2013-12-17 NOTE — Anesthesia Procedure Notes (Signed)
Procedure Name: Intubation Date/Time: 12/17/2013 7:58 AM Performed by: Braniyah Besse Pre-anesthesia Checklist: Patient identified, Emergency Drugs available, Suction available and Patient being monitored Patient Re-evaluated:Patient Re-evaluated prior to inductionOxygen Delivery Method: Circle System Utilized Preoxygenation: Pre-oxygenation with 100% oxygen Intubation Type: IV induction Ventilation: Mask ventilation without difficulty Laryngoscope Size: Mac and 3 Grade View: Grade I Tube type: Oral Number of attempts: 1 Airway Equipment and Method: stylet and LTA kit utilized Placement Confirmation: ETT inserted through vocal cords under direct vision,  positive ETCO2 and breath sounds checked- equal and bilateral Secured at: 21 cm Tube secured with: Tape Dental Injury: Teeth and Oropharynx as per pre-operative assessment

## 2013-12-17 NOTE — Anesthesia Preprocedure Evaluation (Addendum)
Anesthesia Evaluation  Patient identified by MRN, date of birth, ID band Patient awake    Reviewed: Allergy & Precautions, H&P , NPO status , Patient's Chart, lab work & pertinent test results  History of Anesthesia Complications Negative for: history of anesthetic complications  Airway Mallampati: I TM Distance: >3 FB Neck ROM: Full    Dental  (+) Teeth Intact, Dental Advisory Given   Pulmonary neg pulmonary ROS,  breath sounds clear to auscultation  Pulmonary exam normal       Cardiovascular hypertension, Pt. on medications Rhythm:Regular Rate:Normal     Neuro/Psych negative neurological ROS     GI/Hepatic negative GI ROS, Neg liver ROS, S/p gastric bypass   Endo/Other  diabetes, Type 2, Oral Hypoglycemic AgentsMorbid obesity  Renal/GU negative Renal ROS     Musculoskeletal   Abdominal (+) + obese,   Peds  Hematology  (+) anemia ,   Anesthesia Other Findings   Reproductive/Obstetrics S/p BTL                         Anesthesia Physical Anesthesia Plan  ASA: II  Anesthesia Plan: General   Post-op Pain Management:    Induction: Intravenous  Airway Management Planned: Oral ETT  Additional Equipment:   Intra-op Plan:   Post-operative Plan: Extubation in OR  Informed Consent: I have reviewed the patients History and Physical, chart, labs and discussed the procedure including the risks, benefits and alternatives for the proposed anesthesia with the patient or authorized representative who has indicated his/her understanding and acceptance.   Dental advisory given  Plan Discussed with: CRNA and Surgeon  Anesthesia Plan Comments: (Plan routine monitors, GETA)        Anesthesia Quick Evaluation

## 2013-12-17 NOTE — Anesthesia Postprocedure Evaluation (Signed)
  Anesthesia Post-op Note  Patient: Heather Bryan  Procedure(s) Performed: Procedure(s): MAMMARY REDUCTION  (BREAST) BILATERAL WITH LIPOSUCTION (Bilateral)  Patient Location: PACU  Anesthesia Type:General  Level of Consciousness: awake, alert , oriented and patient cooperative  Airway and Oxygen Therapy: Patient Spontanous Breathing  Post-op Pain: mild  Post-op Assessment: Post-op Vital signs reviewed, Patient's Cardiovascular Status Stable, Respiratory Function Stable, Patent Airway, No signs of Nausea or vomiting and Pain level controlled  Post-op Vital Signs: Reviewed and stable  Complications: No apparent anesthesia complications

## 2013-12-17 NOTE — Transfer of Care (Signed)
Immediate Anesthesia Transfer of Care Note  Patient: Heather Bryan  Procedure(s) Performed: Procedure(s): MAMMARY REDUCTION  (BREAST) BILATERAL WITH LIPOSUCTION (Bilateral)  Patient Location: PACU  Anesthesia Type:General  Level of Consciousness: awake, alert , oriented and patient cooperative  Airway & Oxygen Therapy: Patient Spontanous Breathing and Patient connected to face mask oxygen  Post-op Assessment: Report given to PACU RN and Post -op Vital signs reviewed and stable  Post vital signs: Reviewed and stable  Complications: No apparent anesthesia complications

## 2013-12-18 ENCOUNTER — Encounter (HOSPITAL_BASED_OUTPATIENT_CLINIC_OR_DEPARTMENT_OTHER): Payer: Self-pay | Admitting: Plastic Surgery

## 2013-12-18 NOTE — Progress Notes (Signed)
Subjective:    Patient ID: Heather GuerinKim Bryan, female    DOB: 02-12-1966, 48 y.o.   MRN: [id number]  HPI  Heather BattenKim is here today to go over her most recent lab results and to discuss the conditions listed below:   1)  Surgical Clearance:  She is scheduled for breast reduction surgery with Dr.Contogiannis and needs an EKG and a surgical clearance letter.    2)  Hemorrhoids:  She continues to struggle with this problem.  She has used her Anusol suppositories and would like another prescription for it.   3)  Type II DM:  She needs a refill for Invokana and metformin.    4)  Hypertension:  Her BP is controlled on valsartan-HCT.    5)  Vitamin D Deficiency:  She needs a refill of her Vitamin D.    6)  Weight:  She has done well on Qsymia and would like to continue on it.     Review of Systems  Constitutional: Negative for activity change, fatigue and unexpected weight change.  HENT: Negative.   Eyes: Negative.   Respiratory: Negative for shortness of breath.   Cardiovascular: Negative for chest pain, palpitations and leg swelling.  Gastrointestinal: Negative for diarrhea and constipation.  Endocrine: Negative.   Genitourinary: Negative for difficulty urinating.  Musculoskeletal: Negative.   Skin: Negative.   Neurological: Negative.   Hematological: Negative for adenopathy. Does not bruise/bleed easily.  Psychiatric/Behavioral: Negative for sleep disturbance and dysphoric mood. The patient is not nervous/anxious.      Past Medical History  Diagnosis Date  . Hypertension   . Anemia   . Secondary hyperparathyroidism   . Unspecified vitamin D deficiency   . Obesity, unspecified   . Type II diabetes mellitus   . Impaired fasting glucose   . Swelling of limb   . Wears glasses      History   Social History Narrative   Marital Status: Divorced   Children:  Sons (2) Bud/Demarco   Pets: No   Living Situation: Lives with her youngest son.     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 Diabetes Father   . Heart disease Father   . Hypertension Father   . Diabetes Maternal Aunt   . Diabetes Maternal Uncle   . Diabetes Paternal Aunt   . Diabetes Paternal Uncle   . Heart disease Mother   . Hypertension Mother      Current Outpatient Prescriptions on File Prior to Visit  Medication Sig Dispense Refill  . FeAsp-FeFum -Suc-C-Thre-B12-FA (MULTIGEN PLUS PO) Take by mouth.      Marland Kitchen. ibuprofen (ADVIL,MOTRIN) 800 MG tablet Take 800 mg by mouth every 8 (eight) hours as needed for pain.       No current facility-administered medications on file prior to visit.     No Known Allergies   Immunization History  Administered Date(s) Administered  . Pneumococcal-Unspecified 04/16/2012  . Tdap 06/01/2007       Objective:   Physical Exam  Vitals reviewed. Constitutional: She is oriented to person, place, and time.  Eyes: Conjunctivae are normal. No scleral icterus.  Neck:  Neck supple. No thyromegaly present.  Cardiovascular: Normal rate, regular rhythm and normal heart sounds.   Pulmonary/Chest: Effort normal and breath sounds normal.  Musculoskeletal: She exhibits no edema and no tenderness.  Lymphadenopathy:    She has no cervical adenopathy.  Neurological: She is alert and oriented to person, place, and time.  Skin: Skin is warm and dry.  Psychiatric: She has a normal mood and affect. Her behavior is normal. Judgment and thought content normal.       Assessment & Plan:    Heather Bryan was seen today for medication management.  Diagnoses and associated orders for this visit:  Type II or unspecified type diabetes mellitus without mention of complication, not stated as uncontrolled - metFORMIN (GLUCOPHAGE) 1000 MG tablet; Take 1 tablet  (1,000 mg total) by mouth 2 (two) times daily with a meal. - Canagliflozin (INVOKANA) 300 MG TABS; Take 1 tablet (300 mg total) by mouth daily.  Hemorrhoids - hydrocortisone (ANUSOL-HC) 25 MG suppository; Place 1 suppository (25 mg total) rectally 2 (two) times daily as needed for hemorrhoids.  Essential hypertension, benign - valsartan-hydrochlorothiazide (DIOVAN-HCT) 320-25 MG per tablet; Take 1 tablet by mouth daily.  Unspecified vitamin D deficiency - Vitamin D, Ergocalciferol, (DRISDOL) 50000 UNITS CAPS capsule; Take 1 capsule (50,000 Units total) by mouth 2 (two) times a week.  Swelling of limb - furosemide (LASIX) 40 MG tablet; Take 1 tab po prn increased edema  Obesity, unspecified - Phentermine-Topiramate 7.5-46 MG CP24; Take 1 tablet by mouth daily.  Preoperative clearance - EKG 12-Lead  TIME SPENT "FACE TO FACE" WITH PATIENT -  45 MINS

## 2014-01-15 ENCOUNTER — Other Ambulatory Visit (HOSPITAL_COMMUNITY)
Admission: RE | Admit: 2014-01-15 | Discharge: 2014-01-15 | Disposition: A | Discharge status: Home / Self Care | Payer: 59 | Source: Ambulatory Visit | Attending: Family Medicine | Admitting: Family Medicine

## 2014-01-15 ENCOUNTER — Encounter: Payer: Self-pay | Admitting: Family Medicine

## 2014-01-15 ENCOUNTER — Ambulatory Visit (INDEPENDENT_AMBULATORY_CARE_PROVIDER_SITE_OTHER): Payer: 59 | Admitting: Family Medicine

## 2014-01-15 VITALS — BP 121/82 | HR 80 | Resp 16 | Wt 166.0 lb

## 2014-01-15 DIAGNOSIS — Z1151 Encounter for screening for human papillomavirus (HPV): Secondary | ICD-10-CM | POA: Insufficient documentation

## 2014-01-15 DIAGNOSIS — E119 Type 2 diabetes mellitus without complications: Secondary | ICD-10-CM

## 2014-01-15 DIAGNOSIS — E213 Hyperparathyroidism, unspecified: Secondary | ICD-10-CM

## 2014-01-15 DIAGNOSIS — R87810 Cervical high risk human papillomavirus (HPV) DNA test positive: Secondary | ICD-10-CM | POA: Insufficient documentation

## 2014-01-15 DIAGNOSIS — Z124 Encounter for screening for malignant neoplasm of cervix: Secondary | ICD-10-CM | POA: Insufficient documentation

## 2014-01-15 DIAGNOSIS — Z0000 Encounter for general adult medical examination without abnormal findings: Secondary | ICD-10-CM

## 2014-01-15 LAB — POCT URINALYSIS DIPSTICK
Bilirubin, UA: NEGATIVE
Blood, UA: NEGATIVE
Glucose, UA: 2000
Ketones, UA: NEGATIVE
Leukocytes, UA: NEGATIVE
Nitrite, UA: NEGATIVE
Protein, UA: NEGATIVE
Spec Grav, UA: <=1.005
Urobilinogen, UA: NEGATIVE
pH, UA: 7

## 2014-01-15 LAB — POCT UA - MICROALBUMIN
Albumin/Creatinine Ratio, Urine, POC: 88.4
Creatinine, POC: 5 mg/dL
Microalbumin Ur, POC: 5.7 mg/L

## 2014-01-15 NOTE — Progress Notes (Signed)
Subjective:    Patient ID: Heather Bryan, female    DOB: 26-Mar-1966, 48 y.o.   MRN: [id number]  HPI   Heather Bryan is here today for a CPE with a Pap smear.  She has done well since her last office visit.  She does not have any medical concerns today.    Review of Systems  Constitutional: Negative for activity change, appetite change, fatigue and unexpected weight change.  HENT: Negative for congestion, dental problem, ear pain, hearing loss, trouble swallowing and voice change.   Eyes: Negative for pain, redness and visual disturbance.  Respiratory: Negative for cough and shortness of breath.   Cardiovascular: Negative for chest pain, palpitations and leg swelling.  Gastrointestinal: Negative for nausea, vomiting, abdominal pain, diarrhea, constipation and blood in stool.  Endocrine: Negative for cold intolerance, heat intolerance, polydipsia, polyphagia and polyuria.  Genitourinary: Negative for dysuria, urgency, frequency, hematuria, vaginal discharge and pelvic pain.  Musculoskeletal: Negative for arthralgias, back pain, joint swelling, myalgias and neck pain.  Skin: Negative for rash.  Neurological: Negative for dizziness, weakness and headaches.  Hematological: Negative for adenopathy. Does not bruise/bleed easily.  Psychiatric/Behavioral: Negative for sleep disturbance, dysphoric mood and decreased concentration. The patient is not nervous/anxious.      Past Medical History  Diagnosis Date  . Hypertension   . Anemia   . Secondary hyperparathyroidism   . Unspecified vitamin D deficiency   . Obesity, unspecified   . Type II diabetes mellitus   . Impaired fasting glucose   . Swelling of limb   . Wears glasses      Past Surgical History  Procedure Laterality Date  . Gastric bypass  2002  . Tubal ligation    . Breast reduction surgery Bilateral 12/17/2013    Procedure: MAMMARY REDUCTION  (BREAST) BILATERAL WITH LIPOSUCTION;  Surgeon: Karie FetchMary A Contogiannis, MD;  Location: [facility];  Service: Plastics;  Laterality: Bilateral;  . Breast surgery  12/17/2013    breast reduction and lumps removed from left breast (begin)     History   Social History Narrative   Marital Status: Divorced   Children:  Sons (2) Bud/Demarco   Pets: No   Living Situation: Lives with her youngest son.     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 Diabetes Father   . Heart disease Father   . Hypertension Father   . Diabetes Maternal Aunt   . Diabetes Maternal Uncle   . Diabetes Paternal Aunt   . Diabetes Paternal Uncle   . Heart disease Mother   . Hypertension Mother      Current Outpatient Prescriptions on File Prior to Visit  Medication Sig Dispense Refill  . Canagliflozin (INVOKANA) 300 MG TABS Take 1 tablet (300 mg total) by mouth daily.  30 tablet  5  . FeAsp-FeFum -Suc-C-Thre-B12-FA (MULTIGEN PLUS PO) Take by mouth.      . furosemide (LASIX) 40 MG tablet Take 1 tab po prn increased edema  90 tablet  1  . hydrocortisone (ANUSOL-HC) 25 MG suppository Place 1 suppository (25 mg total) rectally 2 (two) times daily as needed for  hemorrhoids.  24 suppository  11  . ibuprofen (ADVIL,MOTRIN) 800 MG tablet Take 800 mg by mouth every 8 (eight) hours as needed for pain.      . metFORMIN (GLUCOPHAGE) 1000 MG tablet Take 1 tablet (1,000 mg total) by mouth 2 (two) times daily with a meal.  60 tablet  11  . Phentermine-Topiramate 7.5-46 MG CP24 Take 1 tablet by mouth daily.  30 capsule  2  . valsartan-hydrochlorothiazide (DIOVAN-HCT) 320-25 MG per tablet Take 1 tablet by mouth daily.  90 tablet  1  . Vitamin D, Ergocalciferol, (DRISDOL) 50000 UNITS CAPS capsule Take 1 capsule (50,000 Units total) by mouth 2 (two) times a week.  24 capsule  3     No current facility-administered medications on file prior to visit.     No Known Allergies   Immunization History  Administered Date(s) Administered  . Pneumococcal-Unspecified 04/16/2012  . Tdap 06/01/2007      Objective:   Physical Exam  Constitutional: She is oriented to person, place, and time. She appears well-developed and well-nourished.  HENT:  Head: Normocephalic and atraumatic.  Right Ear: External ear normal.  Left Ear: External ear normal.  Nose: Nose normal.  Mouth/Throat: Oropharynx is clear and moist.  Eyes: Conjunctivae and EOM are normal. Pupils are equal, round, and reactive to light.  Neck: Normal range of motion. No thyromegaly present.  Cardiovascular: Normal rate, regular rhythm, normal heart sounds and intact distal pulses.  Exam reveals no gallop and no friction rub.   No murmur heard. Pulmonary/Chest: Effort normal and breath sounds normal. Breasts are symmetrical.    Abdominal: Soft. Bowel sounds are normal. Hernia confirmed negative in the right inguinal area and confirmed negative in the left inguinal area.  Genitourinary: Vagina normal and uterus normal. Pelvic exam was performed with patient supine. There is no rash, tenderness or lesion on the right labia. There is no rash, tenderness or lesion on the left labia. No vaginal discharge found.  Musculoskeletal: Normal range of motion. She exhibits no edema and no tenderness.  Lymphadenopathy:    She has no cervical adenopathy.       Right: No inguinal adenopathy present.       Left: No inguinal adenopathy present.  Neurological: She is alert and oriented to person, place, and time. She has normal reflexes.  Skin: Skin is warm and dry.  Psychiatric: She has a normal mood and affect. Her behavior is normal. Judgment and thought content normal.       Assessment & Plan:    Heather Bryan was seen today for annual exam.  Diagnoses and associated orders for this visit:  Routine general medical  examination at a health care facility - POCT urinalysis dipstick  Screening for malignant neoplasm of the cervix - Cytology - PAP  Type II or unspecified type diabetes mellitus without mention of complication, not stated as uncontrolled Comments: We checked a microalbumin which was normal. - POCT UA - Microalbumin  Hyperparathyroidism Comments: She was evaluated by Dr. Katrinka BlazingSmith.  She says that he thinks she has an adenoma and was going to order a scan.  We'll order it for her.

## 2014-01-17 DIAGNOSIS — E213 Hyperparathyroidism, unspecified: Secondary | ICD-10-CM | POA: Insufficient documentation

## 2014-01-17 DIAGNOSIS — Z0000 Encounter for general adult medical examination without abnormal findings: Secondary | ICD-10-CM | POA: Insufficient documentation

## 2014-01-17 DIAGNOSIS — Z124 Encounter for screening for malignant neoplasm of cervix: Secondary | ICD-10-CM | POA: Insufficient documentation

## 2014-01-26 ENCOUNTER — Encounter: Payer: Self-pay | Admitting: Family Medicine

## 2014-02-06 ENCOUNTER — Encounter (HOSPITAL_COMMUNITY)
Admission: RE | Admit: 2014-02-06 | Discharge: 2014-02-06 | Disposition: A | Discharge status: Home / Self Care | Payer: 59 | Source: Ambulatory Visit | Attending: Family Medicine | Admitting: Family Medicine

## 2014-02-06 ENCOUNTER — Encounter: Payer: Self-pay | Admitting: Family Medicine

## 2014-02-06 ENCOUNTER — Ambulatory Visit (HOSPITAL_COMMUNITY)
Admission: RE | Admit: 2014-02-06 | Discharge: 2014-02-06 | Disposition: A | Discharge status: Home / Self Care | Payer: 59 | Source: Ambulatory Visit | Attending: Family Medicine | Admitting: Family Medicine

## 2014-02-06 DIAGNOSIS — E213 Hyperparathyroidism, unspecified: Secondary | ICD-10-CM | POA: Insufficient documentation

## 2014-02-06 MED ORDER — TECHNETIUM TC 99M SESTAMIBI GENERIC - CARDIOLITE
25.0000 | Freq: Once | INTRAVENOUS | Status: AC | PRN
Start: 2014-02-06 — End: 2014-02-06
  Administered 2014-02-06: 25 via INTRAVENOUS

## 2014-02-13 NOTE — Op Note (Signed)
NAME:  Heather Bryan.:  [id number]  MEDICAL RECORD NO.:  [id number]  LOCATION:                                 FACILITY:  PHYSICIAN:  Brantley Persons, M.D.DATE OF BIRTH:  06-22-1966  DATE OF PROCEDURE:  12/17/2013 DATE OF DISCHARGE:  12/17/2013                              OPERATIVE REPORT   PREOPERATIVE DIAGNOSIS:  Bilateral macromastia.  POSTOPERATIVE DIAGNOSIS:  Bilateral macromastia.  PROCEDURE:  Bilateral reduction mammoplasties.  ATTENDING SURGEON:  Brantley Persons, M.D.  ANESTHESIA:  General.  COMPLICATIONS:  None.  INDICATION FOR THE PROCEDURE:  The patient is a 48 year old, African American female, has bilateral macromastia that is clinically symptomatic.  She presents to undergo bilateral reduction mammoplasties.  PROCEDURE:  The patient was marked in the preop holding area in the pattern of Wise for the future bilateral reduction mammoplasties.  She was then taken back to the OR and prepped over the back pain from the neck to the waist and around to the anterior axillary area while standing beside the bed.  She was then laid on sterile sheets, covered with a sterile sheet and general anesthesia was induced.  We then proceeded with the rest of the prep.  The breasts, chest, and axillary areas were then prepped with Techni-Care, draped in sterile fashion. This prep was performed because I will likely need to perform some liposuction of the lateral breast tissue that she has along the lateral breast and chest areas.  The purpose of this is to limit the incision laterally.  The bases of the breasts had been injected with 1% lidocaine with epinephrine.  After adequate hemostasis, anesthesia taken effect, the procedure was begun.  Both of the breast reductions were performed in the following similar manner.  Both of the procedures were performed in the following similar manner.  The breast soft tissue along the lateral aspect of the  breast and chest were infiltrated with tumescent fluid.  The amount is noted on the accompanying nursing notes.  The tumescent fluid was allowed to work well and proceeded with the major portion of the reduction.  The nipple- areolar complex was marked with a 45-mm nipple marker.  The skin was then incised and deepithelialized around the nipple-areolar complex down to the inframammary crease in inferior pedicle pattern.  Next, the medial, superior, and lateral skin flaps were elevated down to the chest wall.  The excess fat and glandular tissue were removed from the patient's breast.  At this point, I then proceeded with power-assisted liposuction using the SAFE technique.  This allowed me to remove the additional breast tissue that was present laterally along her breast and chest area without extending the incision more lateral.  This amount of breast tissue removed was also included part of the specimen that was sent to pathology.  Meticulous hemostasis was obtained.  The nipple- areolar complex was examined and found to be pink and viable.  The inferior pedicle was centralized using 3-0 Prolene suture.  A #10 JP flat, fully-fluted drain was placed into the wound.  The skin flaps brought together at the inverted T junction with a 2-0 Prolene suture. The incisions were stapled  for temporary closure.  The breasts compared and found to have good shape and symmetry.  The incisions were then closed from the medial aspect of the JP drain to the medial aspect of the [facility] incision by first placing a few 3-0 Monocryl sutures to loosely tack together the dermal layer, and then both the dermal and cuticular layers were closed in a single layer using a 2-0 Quill PDO barbed suture.  Lateral to the JP drain incision was closed using 3-0 Monocryl in the dermal layer, followed by 3-0 Monocryl running intracuticular stitch on the skin.  The vertical limb of the Wise pattern was then closed using 3-0  Monocryl in the dermal layer.  The patient was then placed in the upright position.  The future location of the nipple-areolar complex was marked on both breast mounds using a 45-mm nipple marker.  She was then placed back in the recumbent position.  Both of the nipple-areolar complexes were brought out onto the breast mounds in the following similar manner.  The skin was incised as marked and removed full-thickness subcutaneous tissues.  The nipple-areolar complexes were examined, found to be pink and viable, then brought out through this aperture, and sewn in place using 4-0 Monocryl in the dermal layer, followed by 5-0 Monocryl running intracuticular stitch on the skin.  This 5-0 Monocryl suture was then brought down from the closure of the nipple-areolar complex to close the cuticular layer of the vertical limb as well.  The JP drain was sewn in place using 3-0 nylon suture.  Prior to closing the breast wounds, the pectoralis major muscle, and fascia along with the breast and chest wall soft tissues had been infiltrated with 1.3% Exparel (total 266 mg) in order to provide postoperative pain control for the patient.  The incisions were dressed with benzoin and Steri-Strips, and the nipples additionally with bacitracin ointment and Adaptic.  4x4s were placed over the incisions and additionally ABD pads along the lateral breast/chest areas and axillary areas to provide gentle compression.  She was then placed into light postoperative support bra.  There were no complications.  The patient tolerated the procedure well.  The final needle, sponge counts were reported to be correct at the end of the case.  The patient also recovered without complications.  Both the patient and her family were given proper postoperative wound care instructions including care of the JP drains.  She was then discharged to home in a stable condition in the care of her family.  Followup appointment will be  within a few days in the office.          ______________________________ Brantley PersonsMary Juluis Bryan, M.D.     MC/MEDQ  D:  02/13/2014  T:  02/13/2014  Job:  [id number]

## 2014-02-13 NOTE — Op Note (Deleted)
NAME:  Heather Bryan.:  [id number]  MEDICAL RECORD NO.:  [id number]  LOCATION:                                 FACILITY:  PHYSICIAN:  Brantley Persons, M.D.DATE OF BIRTH:  06-22-1966  DATE OF PROCEDURE:  12/17/2013 DATE OF DISCHARGE:  12/17/2013                              OPERATIVE REPORT   PREOPERATIVE DIAGNOSIS:  Bilateral macromastia.  POSTOPERATIVE DIAGNOSIS:  Bilateral macromastia.  PROCEDURE:  Bilateral reduction mammoplasties.  ATTENDING SURGEON:  Brantley Persons, M.D.  ANESTHESIA:  General.  COMPLICATIONS:  None.  INDICATION FOR THE PROCEDURE:  The patient is a 48 year old, African American female, has bilateral macromastia that is clinically symptomatic.  She presents to undergo bilateral reduction mammoplasties.  PROCEDURE:  The patient was marked in the preop holding area in the pattern of Wise for the future bilateral reduction mammoplasties.  She was then taken back to the OR and prepped over the back pain from the neck to the waist and around to the anterior axillary area while standing beside the bed.  She was then laid on sterile sheets, covered with a sterile sheet and general anesthesia was induced.  We then proceeded with the rest of the prep.  The breasts, chest, and axillary areas were then prepped with Techni-Care, draped in sterile fashion. This prep was performed because I will likely need to perform some liposuction of the lateral breast tissue that she has along the lateral breast and chest areas.  The purpose of this is to limit the incision laterally.  The bases of the breasts had been injected with 1% lidocaine with epinephrine.  After adequate hemostasis, anesthesia taken effect, the procedure was begun.  Both of the breast reductions were performed in the following similar manner.  Both of the procedures were performed in the following similar manner.  The breast soft tissue along the lateral aspect of the  breast and chest were infiltrated with tumescent fluid.  The amount is noted on the accompanying nursing notes.  The tumescent fluid was allowed to work well and proceeded with the major portion of the reduction.  The nipple- areolar complex was marked with a 45-mm nipple marker.  The skin was then incised and deepithelialized around the nipple-areolar complex down to the inframammary crease in inferior pedicle pattern.  Next, the medial, superior, and lateral skin flaps were elevated down to the chest wall.  The excess fat and glandular tissue were removed from the patient's breast.  At this point, I then proceeded with power-assisted liposuction using the SAFE technique.  This allowed me to remove the additional breast tissue that was present laterally along her breast and chest area without extending the incision more lateral.  This amount of breast tissue removed was also included part of the specimen that was sent to pathology.  Meticulous hemostasis was obtained.  The nipple- areolar complex was examined and found to be pink and viable.  The inferior pedicle was centralized using 3-0 Prolene suture.  A #10 JP flat, fully-fluted drain was placed into the wound.  The skin flaps brought together at the inverted T junction with a 2-0 Prolene suture. The incisions were stapled  for temporary closure.  The breasts compared and found to have good shape and symmetry.  The incisions were then closed from the medial aspect of the JP drain to the medial aspect of the [facility] incision by first placing a few 3-0 Monocryl sutures to loosely tack together the dermal layer, and then both the dermal and cuticular layers were closed in a single layer using a 2-0 Quill PDO barbed suture.  Lateral to the JP drain incision was closed using 3-0 Monocryl in the dermal layer, followed by 3-0 Monocryl running intracuticular stitch on the skin.  The vertical limb of the Wise pattern was then closed using 3-0  Monocryl in the dermal layer.  The patient was then placed in the upright position.  The future location of the nipple-areolar complex was marked on both breast mounds using a 45-mm nipple marker.  She was then placed back in the recumbent position.  Both of the nipple-areolar complexes were brought out onto the breast mounds in the following similar manner.  The skin was incised as marked and removed full-thickness subcutaneous tissues.  The nipple-areolar complexes were examined, found to be pink and viable, then brought out through this aperture, and sewn in place using 4-0 Monocryl in the dermal layer, followed by 5-0 Monocryl running intracuticular stitch on the skin.  This 5-0 Monocryl suture was then brought down from the closure of the nipple-areolar complex to close the cuticular layer of the vertical limb as well.  The JP drain was sewn in place using 3-0 nylon suture.  Prior to closing the breast wounds, the pectoralis major muscle, and fascia along with the breast and chest wall soft tissues had been infiltrated with 1.3% Exparel (total 266 mg) in order to provide postoperative pain control for the patient.  The incisions were dressed with benzoin and Steri-Strips, and the nipples additionally with bacitracin ointment and Adaptic.  4x4s were placed over the incisions and additionally ABD pads along the lateral breast/chest areas and axillary areas to provide gentle compression.  She was then placed into light postoperative support bra.  There were no complications.  The patient tolerated the procedure well.  The final needle, sponge counts were reported to be correct at the end of the case.  The patient also recovered without complications.  Both the patient and her family were given proper postoperative wound care instructions including care of the JP drains.  She was then discharged to home in a stable condition in the care of her family.  Followup appointment will be  within a few days in the office.          ______________________________ Mary Contogiannis, M.D.     MC/MEDQ  D:  02/13/2014  T:  02/13/2014  Job:  [id number] 

## 2015-08-06 ENCOUNTER — Other Ambulatory Visit (HOSPITAL_COMMUNITY)
Admission: RE | Admit: 2015-08-06 | Discharge: 2015-08-06 | Disposition: A | Discharge status: Home / Self Care | Payer: 59 | Source: Ambulatory Visit | Attending: Family Medicine | Admitting: Family Medicine

## 2015-08-06 DIAGNOSIS — D649 Anemia, unspecified: Secondary | ICD-10-CM | POA: Diagnosis not present

## 2015-08-06 LAB — COMPREHENSIVE METABOLIC PANEL
ALT: 18 U/L (ref 14–54)
AST: 21 U/L (ref 15–41)
Albumin: 4.5 g/dL (ref 3.5–5.0)
Alkaline Phosphatase: 59 U/L (ref 38–126)
Anion gap: 8 (ref 5–15)
BUN: 15 mg/dL (ref 6–20)
CO2: 30 mmol/L (ref 22–32)
Calcium: 9.9 mg/dL (ref 8.9–10.3)
Chloride: 103 mmol/L (ref 101–111)
Creatinine, Ser: 1.07 mg/dL — ABNORMAL HIGH (ref 0.44–1.00)
GFR calc Af Amer: >60 mL/min (ref >60)
GFR calc non Af Amer: >60 mL/min (ref >60)
Glucose, Bld: 66 mg/dL (ref 65–99)
Potassium: 3.4 mmol/L — ABNORMAL LOW (ref 3.5–5.1)
Sodium: 141 mmol/L (ref 135–145)
Total Bilirubin: 0.3 mg/dL (ref 0.3–1.2)
Total Protein: 7.5 g/dL (ref 6.5–8.1)

## 2015-08-06 LAB — CBC WITH DIFFERENTIAL/PLATELET
Basophils Absolute: 0 10*3/uL (ref 0.0–0.1)
Basophils Relative: 0 %
Eosinophils Absolute: 0.2 10*3/uL (ref 0.0–0.7)
Eosinophils Relative: 4 %
HCT: 38.9 % (ref 36.0–46.0)
Hemoglobin: 13 g/dL (ref 12.0–15.0)
Lymphocytes Relative: 48 %
Lymphs Abs: 2.8 10*3/uL (ref 0.7–4.0)
MCH: 31.3 pg (ref 26.0–34.0)
MCHC: 33.4 g/dL (ref 30.0–36.0)
MCV: 93.7 fL (ref 78.0–100.0)
Monocytes Absolute: 0.3 10*3/uL (ref 0.1–1.0)
Monocytes Relative: 5 %
Neutro Abs: 2.4 10*3/uL (ref 1.7–7.7)
Neutrophils Relative %: 43 %
Platelets: 190 10*3/uL (ref 150–400)
RBC: 4.15 MIL/uL (ref 3.87–5.11)
RDW: 13.2 % (ref 11.5–15.5)
WBC: 5.7 10*3/uL (ref 4.0–10.5)

## 2015-08-06 LAB — LIPID PANEL
Cholesterol: 259 mg/dL — ABNORMAL HIGH (ref 0–200)
HDL: 125 mg/dL (ref >40)
LDL Cholesterol: 119 mg/dL — ABNORMAL HIGH (ref 0–99)
Total CHOL/HDL Ratio: 2.1 RATIO
Triglycerides: 75 mg/dL (ref <150)
VLDL: 15 mg/dL (ref 0–40)

## 2015-08-06 LAB — TSH: TSH: 1.479 u[IU]/mL (ref 0.350–4.500)

## 2015-09-04 ENCOUNTER — Other Ambulatory Visit (HOSPITAL_COMMUNITY): Payer: Self-pay | Admitting: Orthopedic Surgery

## 2015-09-04 DIAGNOSIS — M1631 Unilateral osteoarthritis resulting from hip dysplasia, right hip: Secondary | ICD-10-CM

## 2015-09-23 ENCOUNTER — Ambulatory Visit (HOSPITAL_COMMUNITY): Admission: RE | Admit: 2015-09-23 | Payer: 59 | Source: Ambulatory Visit

## 2015-09-23 ENCOUNTER — Ambulatory Visit (HOSPITAL_COMMUNITY): Admission: RE | Admit: 2015-09-23 | Payer: Self-pay | Source: Ambulatory Visit

## 2016-03-11 ENCOUNTER — Other Ambulatory Visit (HOSPITAL_BASED_OUTPATIENT_CLINIC_OR_DEPARTMENT_OTHER): Payer: Self-pay | Admitting: Family Medicine

## 2016-03-11 DIAGNOSIS — Z1231 Encounter for screening mammogram for malignant neoplasm of breast: Secondary | ICD-10-CM

## 2016-03-15 ENCOUNTER — Ambulatory Visit (HOSPITAL_BASED_OUTPATIENT_CLINIC_OR_DEPARTMENT_OTHER)
Admission: RE | Admit: 2016-03-15 | Discharge: 2016-03-15 | Disposition: A | Discharge status: Home / Self Care | Payer: 59 | Source: Ambulatory Visit | Attending: Family Medicine | Admitting: Family Medicine

## 2016-03-15 DIAGNOSIS — Z1231 Encounter for screening mammogram for malignant neoplasm of breast: Secondary | ICD-10-CM | POA: Diagnosis not present

## 2016-04-01 ENCOUNTER — Other Ambulatory Visit: Payer: Self-pay | Admitting: Family Medicine

## 2016-04-01 LAB — CBC WITH DIFFERENTIAL/PLATELET
Basophils Absolute: 0 10*3/uL (ref 0.0–0.1)
Basophils Relative: 0 %
Eosinophils Absolute: 0.2 10*3/uL (ref 0.0–0.7)
Eosinophils Relative: 4 %
HCT: 42.3 % (ref 36.0–46.0)
Hemoglobin: 13.6 g/dL (ref 12.0–15.0)
Lymphocytes Relative: 51 %
Lymphs Abs: 2.5 10*3/uL (ref 0.7–4.0)
MCH: 30.3 pg (ref 26.0–34.0)
MCHC: 32.2 g/dL (ref 30.0–36.0)
MCV: 94.2 fL (ref 78.0–100.0)
Monocytes Absolute: 0.2 10*3/uL (ref 0.1–1.0)
Monocytes Relative: 4 %
Neutro Abs: 2 10*3/uL (ref 1.7–7.7)
Neutrophils Relative %: 41 %
Platelets: 189 10*3/uL (ref 150–400)
RBC: 4.49 MIL/uL (ref 3.87–5.11)
RDW: 13.9 % (ref 11.5–15.5)
WBC: 4.9 10*3/uL (ref 4.0–10.5)

## 2016-04-01 LAB — LIPID PANEL
Cholesterol: 245 mg/dL — ABNORMAL HIGH (ref 0–200)
HDL: 126 mg/dL (ref >40)
LDL Cholesterol: 108 mg/dL — ABNORMAL HIGH (ref 0–99)
Total CHOL/HDL Ratio: 1.9 RATIO
Triglycerides: 55 mg/dL (ref <150)
VLDL: 11 mg/dL (ref 0–40)

## 2016-04-01 LAB — COMPREHENSIVE METABOLIC PANEL
ALT: 41 U/L (ref 14–54)
AST: 36 U/L (ref 15–41)
Albumin: 4.9 g/dL (ref 3.5–5.0)
Alkaline Phosphatase: 79 U/L (ref 38–126)
Anion gap: 8 (ref 5–15)
BUN: 22 mg/dL — ABNORMAL HIGH (ref 6–20)
CO2: 30 mmol/L (ref 22–32)
Calcium: 9.9 mg/dL (ref 8.9–10.3)
Chloride: 102 mmol/L (ref 101–111)
Creatinine, Ser: 0.86 mg/dL (ref 0.44–1.00)
GFR calc Af Amer: >60 mL/min (ref >60)
GFR calc non Af Amer: >60 mL/min (ref >60)
Glucose, Bld: 78 mg/dL (ref 65–99)
Potassium: 3.5 mmol/L (ref 3.5–5.1)
Sodium: 140 mmol/L (ref 135–145)
Total Bilirubin: 0.5 mg/dL (ref 0.3–1.2)
Total Protein: 7.6 g/dL (ref 6.5–8.1)

## 2016-04-02 LAB — HEMOGLOBIN A1C
Hgb A1c MFr Bld: 5.3 % (ref 4.8–5.6)
Mean Plasma Glucose: 105 mg/dL

## 2016-04-04 DIAGNOSIS — Z124 Encounter for screening for malignant neoplasm of cervix: Secondary | ICD-10-CM | POA: Diagnosis not present

## 2016-04-04 DIAGNOSIS — D649 Anemia, unspecified: Secondary | ICD-10-CM | POA: Diagnosis not present

## 2016-04-04 DIAGNOSIS — E785 Hyperlipidemia, unspecified: Secondary | ICD-10-CM | POA: Diagnosis not present

## 2016-04-04 DIAGNOSIS — E119 Type 2 diabetes mellitus without complications: Secondary | ICD-10-CM | POA: Diagnosis not present

## 2016-04-04 DIAGNOSIS — Z0000 Encounter for general adult medical examination without abnormal findings: Secondary | ICD-10-CM | POA: Diagnosis not present

## 2016-04-05 LAB — VITAMIN D 1,25 DIHYDROXY
Vitamin D 1, 25 (OH)2 Total: 66 pg/mL
Vitamin D2 1, 25 (OH)2: 58 pg/mL
Vitamin D3 1, 25 (OH)2: <10 pg/mL

## 2016-05-25 DIAGNOSIS — I10 Essential (primary) hypertension: Secondary | ICD-10-CM | POA: Diagnosis not present

## 2016-05-25 DIAGNOSIS — R252 Cramp and spasm: Secondary | ICD-10-CM | POA: Diagnosis not present

## 2016-05-25 DIAGNOSIS — E119 Type 2 diabetes mellitus without complications: Secondary | ICD-10-CM | POA: Diagnosis not present

## 2016-05-25 DIAGNOSIS — D649 Anemia, unspecified: Secondary | ICD-10-CM | POA: Diagnosis not present

## 2016-05-25 DIAGNOSIS — E785 Hyperlipidemia, unspecified: Secondary | ICD-10-CM | POA: Diagnosis not present

## 2016-05-25 DIAGNOSIS — E559 Vitamin D deficiency, unspecified: Secondary | ICD-10-CM | POA: Diagnosis not present

## 2017-03-06 DIAGNOSIS — M79604 Pain in right leg: Secondary | ICD-10-CM | POA: Diagnosis not present

## 2017-03-06 DIAGNOSIS — K649 Unspecified hemorrhoids: Secondary | ICD-10-CM | POA: Diagnosis not present

## 2017-03-06 DIAGNOSIS — E119 Type 2 diabetes mellitus without complications: Secondary | ICD-10-CM | POA: Diagnosis not present

## 2017-03-06 DIAGNOSIS — M25551 Pain in right hip: Secondary | ICD-10-CM | POA: Diagnosis not present

## 2017-03-06 DIAGNOSIS — M79605 Pain in left leg: Secondary | ICD-10-CM | POA: Diagnosis not present

## 2017-03-24 ENCOUNTER — Other Ambulatory Visit (HOSPITAL_BASED_OUTPATIENT_CLINIC_OR_DEPARTMENT_OTHER): Payer: Self-pay | Admitting: Family Medicine

## 2017-03-24 ENCOUNTER — Encounter: Payer: Self-pay | Admitting: Podiatry

## 2017-03-24 ENCOUNTER — Ambulatory Visit (INDEPENDENT_AMBULATORY_CARE_PROVIDER_SITE_OTHER): Payer: 59 | Admitting: Podiatry

## 2017-03-24 ENCOUNTER — Ambulatory Visit (INDEPENDENT_AMBULATORY_CARE_PROVIDER_SITE_OTHER): Payer: 59

## 2017-03-24 DIAGNOSIS — B351 Tinea unguium: Secondary | ICD-10-CM

## 2017-03-24 DIAGNOSIS — M2010 Hallux valgus (acquired), unspecified foot: Secondary | ICD-10-CM

## 2017-03-24 DIAGNOSIS — Z1231 Encounter for screening mammogram for malignant neoplasm of breast: Secondary | ICD-10-CM

## 2017-03-24 DIAGNOSIS — M2041 Other hammer toe(s) (acquired), right foot: Secondary | ICD-10-CM

## 2017-03-24 NOTE — Patient Instructions (Signed)

## 2017-03-24 NOTE — Progress Notes (Signed)
   Subjective:    Patient ID: Heather Bryan, female    DOB: [birth year], 51 y.o.   MRN: [id number]  HPI  Chief Complaint  Patient presents with  . Bunions    B/L bunion pain for more than 1 year.     Review of Systems  Musculoskeletal: Positive for gait problem.       Objective:   Physical Exam        Assessment & Plan:

## 2017-03-24 NOTE — Progress Notes (Signed)
Subjective:    Patient ID: Heather Bryan, female   DOB: 51 y.o.   MRN: [id number]   HPI patient states she's had chronic pain in her bunions for over a year and is tried wider shoes and other modalities that relief and also has a painful fifth digit on the right foot. Has moderate pain in the arch but that's no where near as bad as the big toe joints and states it's been getting worse over this year.    Review of Systems  All other systems reviewed and are negative.       Objective:  Physical Exam  Cardiovascular: Intact distal pulses.   Musculoskeletal: Normal range of motion.  Neurological: She is alert.  Skin: Skin is warm.  Nursing note and vitals reviewed.  neurovascular status intact muscle strength adequate range of motion within normal limits with patient found to have hyperostosis medial aspect first metatarsal head bilateral with redness around the joint surface and pain and noted to have moderate depression of the arch and keratotic lesion fifth digit right. 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 Due to long-standing nature and failure to get better with conservative treatments she wants them fixed and I recommended distal osteotomies and arthroplasty fifth digit right. Patient wants surgery and [facility] and will be seen to discuss in greater detail. Patient also will require long-term orthotics but not to after surgical intervention and we did discuss her diabetes which is under excellent control with her last A1c being 5.9  X-rays indicate that there is elevation of the intermetatarsal angle of approximate 15 bilateral with rotated fifth toe right

## 2017-03-29 ENCOUNTER — Ambulatory Visit: Payer: 59

## 2017-03-29 ENCOUNTER — Ambulatory Visit (INDEPENDENT_AMBULATORY_CARE_PROVIDER_SITE_OTHER): Payer: 59

## 2017-03-29 DIAGNOSIS — Z1231 Encounter for screening mammogram for malignant neoplasm of breast: Secondary | ICD-10-CM

## 2017-04-17 DIAGNOSIS — I10 Essential (primary) hypertension: Secondary | ICD-10-CM | POA: Diagnosis not present

## 2017-04-17 DIAGNOSIS — R609 Edema, unspecified: Secondary | ICD-10-CM | POA: Diagnosis not present

## 2017-04-17 DIAGNOSIS — E119 Type 2 diabetes mellitus without complications: Secondary | ICD-10-CM | POA: Diagnosis not present

## 2017-04-17 DIAGNOSIS — R252 Cramp and spasm: Secondary | ICD-10-CM | POA: Diagnosis not present

## 2017-09-14 ENCOUNTER — Telehealth: Payer: Self-pay | Admitting: *Deleted

## 2017-09-14 NOTE — Telephone Encounter (Signed)
Pt states she would like to schedule surgery for 10/2017. I spoke with pt and she had not signed consent forms, I told pt she would need a consultation appt to discuss the surgery in depth, and sign consent forms. Pt states understanding and I transferred to schedulers.

## 2017-09-15 ENCOUNTER — Telehealth: Payer: Self-pay | Admitting: Podiatry

## 2017-09-15 NOTE — Telephone Encounter (Signed)
Left vm for pt to return call to schedule surgical consultation with Dr. Charlsie Merlesegal

## 2017-09-20 ENCOUNTER — Ambulatory Visit: Payer: 59 | Admitting: Podiatry

## 2017-09-20 ENCOUNTER — Encounter: Payer: Self-pay | Admitting: Podiatry

## 2017-09-20 DIAGNOSIS — M2010 Hallux valgus (acquired), unspecified foot: Secondary | ICD-10-CM | POA: Diagnosis not present

## 2017-09-20 DIAGNOSIS — M2041 Other hammer toe(s) (acquired), right foot: Secondary | ICD-10-CM

## 2017-09-20 NOTE — Patient Instructions (Signed)
Pre-Operative Instructions  Congratulations, you have decided to take an important step towards improving your quality of life.  You can be assured that the doctors and staff at [facility] will be with you every step of the way.  Here are some important things you should know:  1. Plan to be at the surgery center/hospital at least 1 (one) hour prior to your scheduled time, unless otherwise directed by the surgical center/hospital staff.  You must have a responsible adult accompany you, remain during the surgery and drive you home.  Make sure you have directions to the surgical center/hospital to ensure you arrive on time. 2. If you are having surgery at [facility] or [facility] hospitals, you will need a copy of your medical history and physical form from your family physician within one month prior to the date of surgery. We will give you a form for your primary physician to complete.  3. We make every effort to accommodate the date you request for surgery.  However, there are times where surgery dates or times have to be moved.  We will contact you as soon as possible if a change in schedule is required.   4. No aspirin/ibuprofen for one week before surgery.  If you are on aspirin, any non-steroidal anti-inflammatory medications (Mobic, Aleve, Ibuprofen) should not be taken seven (7) days prior to your surgery.  You make take Tylenol for pain prior to surgery.  5. Medications - If you are taking daily heart and blood pressure medications, seizure, reflux, allergy, asthma, anxiety, pain or diabetes medications, make sure you notify the surgery center/hospital before the day of surgery so they can tell you which medications you should take or avoid the day of surgery. 6. No food or drink after midnight the night before surgery unless directed otherwise by surgical center/hospital staff. 7. No alcoholic beverages 24-hours prior to surgery.  No smoking 24-hours prior or 24-hours after  surgery. 8. Wear loose pants or shorts. They should be loose enough to fit over bandages, boots, and casts. 9. Don't wear slip-on shoes. Sneakers are preferred. 10. Bring your boot with you to the surgery center/hospital.  Also bring crutches or a walker if your physician has prescribed it for you.  If you do not have this equipment, it will be provided for you after surgery. 11. If you have not been contacted by the surgery center/hospital by the day before your surgery, call to confirm the date and time of your surgery. 12. Leave-time from work may vary depending on the type of surgery you have.  Appropriate arrangements should be made prior to surgery with your employer. 13. Prescriptions will be provided immediately following surgery by your doctor.  Fill these as soon as possible after surgery and take the medication as directed. Pain medications will not be refilled on weekends and must be approved by the doctor. 14. Remove nail polish on the operative foot and avoid getting pedicures prior to surgery. 15. Wash the night before surgery.  The night before surgery wash the foot and leg well with water and the antibacterial soap provided. Be sure to pay special attention to beneath the toenails and in between the toes.  Wash for at least three (3) minutes. Rinse thoroughly with water and dry well with a towel.  Perform this wash unless told not to do so by your physician.  Enclosed: 1 Ice pack (please put in freezer the night before surgery)   1 Hibiclens skin cleaner     Pre-op instructions  If you have any questions regarding the instructions, please do not hesitate to call our office.  [place]: [address], , [place] [address] -- [phone]  [place]: [address] [place], [place] [address] -- [phone]  [place]: [address]  [place], [place] [address] -- [phone]  [place]: [address], [address], [place], [place] [address] -- [phone]  Website:  [email or web address] 

## 2017-09-20 NOTE — Progress Notes (Signed)
Subjective:   Patient ID: Heather Bryan, female   DOB: 52 y.o.   MRN: [id number]   HPI Patient presents stating she is ready to get both her feet fixed as they are continuing to hurt her a lot and she is been trying wider shoes and she is tried soaks without relief of symptoms   ROS      Objective:  Physical Exam  Neurovascular status intact with structural bunion deformity bilateral with redness and pain and keratotic lesion with hammertoe deformity fifth right and distal lateral keratotic lesion with exostotic lesion digit 5 right structural HAV deformity with hammertoe deformity fifth right and distal lateral exostosis fifth right     Assessment:  Structural HAV deformity bilateral with hammertoe deformity fifth right and distal lateral exostosis but right     Plan:  H&P condition reviewed and at this point I do think long-term correction is warranted due to increased pain and failure to respond conservatively.  I reviewed x-rays with her and allow her to read consent form going over alternative treatments complications and after extensive review patient signed consent form.  She understands total recovery will take 6 months to 1 year and she will have distal osteotomy first bilateral arthroplasty digit 5 right and distal lateral exostectomy digit 5 right.  She is encouraged to call with any questions prior to procedure

## 2017-10-16 ENCOUNTER — Telehealth: Payer: Self-pay | Admitting: *Deleted

## 2017-10-16 NOTE — Telephone Encounter (Signed)
"  I'm scheduled for surgery on February 5.  I need the address to where I'm supposed to go."  Did you receive a brochure from the surgical center?  The address is on the back of the brochure that was given to you.  "Yes I did, but I'm not home.  [place] in [place] today so I wanted to go ahead and find out where it is because this is the only time I have to try to find it.  I'm not from this area."  The address is [address]

## 2017-10-22 DIAGNOSIS — M2012 Hallux valgus (acquired), left foot: Secondary | ICD-10-CM | POA: Diagnosis not present

## 2017-10-22 DIAGNOSIS — M2011 Hallux valgus (acquired), right foot: Secondary | ICD-10-CM | POA: Diagnosis not present

## 2017-10-22 DIAGNOSIS — M2041 Other hammer toe(s) (acquired), right foot: Secondary | ICD-10-CM | POA: Diagnosis not present

## 2017-10-22 DIAGNOSIS — M25774 Osteophyte, right foot: Secondary | ICD-10-CM | POA: Diagnosis not present

## 2017-10-24 ENCOUNTER — Encounter: Payer: Self-pay | Admitting: Podiatry

## 2017-10-24 DIAGNOSIS — M2012 Hallux valgus (acquired), left foot: Secondary | ICD-10-CM | POA: Diagnosis not present

## 2017-10-24 DIAGNOSIS — M21611 Bunion of right foot: Secondary | ICD-10-CM | POA: Diagnosis not present

## 2017-10-24 DIAGNOSIS — M2041 Other hammer toe(s) (acquired), right foot: Secondary | ICD-10-CM | POA: Diagnosis not present

## 2017-10-24 DIAGNOSIS — E7800 Pure hypercholesterolemia, unspecified: Secondary | ICD-10-CM | POA: Diagnosis not present

## 2017-10-24 DIAGNOSIS — M2011 Hallux valgus (acquired), right foot: Secondary | ICD-10-CM | POA: Diagnosis not present

## 2017-10-24 DIAGNOSIS — M25774 Osteophyte, right foot: Secondary | ICD-10-CM | POA: Diagnosis not present

## 2017-10-25 ENCOUNTER — Telehealth: Payer: Self-pay | Admitting: *Deleted

## 2017-10-25 NOTE — Telephone Encounter (Signed)
Called and spoke with the patient and patient stated that she was doing ok and there were no fever,chills,nausea and the pain scale right now was a 6 and is standing some with the walker and I stated to patient that if any concerns or questions to call the office and that we would see her next week for surgery check. Misty StanleyLisa

## 2017-11-01 ENCOUNTER — Encounter: Payer: Self-pay | Admitting: Podiatry

## 2017-11-01 ENCOUNTER — Ambulatory Visit (INDEPENDENT_AMBULATORY_CARE_PROVIDER_SITE_OTHER): Payer: 59

## 2017-11-01 ENCOUNTER — Ambulatory Visit (INDEPENDENT_AMBULATORY_CARE_PROVIDER_SITE_OTHER): Payer: 59 | Admitting: Podiatry

## 2017-11-01 VITALS — BP 137/94 | HR 87 | Temp 99.1°F

## 2017-11-01 DIAGNOSIS — M2041 Other hammer toe(s) (acquired), right foot: Secondary | ICD-10-CM | POA: Diagnosis not present

## 2017-11-01 DIAGNOSIS — M2010 Hallux valgus (acquired), unspecified foot: Secondary | ICD-10-CM

## 2017-11-01 NOTE — Progress Notes (Signed)
Subjective:   Patient ID: Heather Bryan, female   DOB: 52 y.o.   MRN: [id number]   HPI Patient states doing very well with surgery and very happy that unable to walk and that I did both feet at the same time   ROS      Objective:  Physical Exam  Neurovascular status intact with good healing of the osteotomy first metatarsal bilateral and digital correction fifth digit right foot.  Patient has negative Homans sign noted and wound edges are well coapted     Assessment:  Doing very well post osteotomy bilateral arthroplasty fifth digit right     Plan:  X-rays reviewed with patient and discussed importance of continued elevation compression immobilization and patient will be seen back again in approximately 2 weeks or earlier if needed  X-rays indicate the osteotomies are healing well joint congruous with excellent reduction of the intermetatarsal angle

## 2017-11-15 ENCOUNTER — Encounter: Payer: Self-pay | Admitting: Podiatry

## 2017-11-15 ENCOUNTER — Ambulatory Visit (INDEPENDENT_AMBULATORY_CARE_PROVIDER_SITE_OTHER): Payer: 59 | Admitting: Podiatry

## 2017-11-15 ENCOUNTER — Ambulatory Visit (INDEPENDENT_AMBULATORY_CARE_PROVIDER_SITE_OTHER): Payer: 59

## 2017-11-15 VITALS — BP 123/79 | HR 88 | Resp 16

## 2017-11-15 DIAGNOSIS — M2041 Other hammer toe(s) (acquired), right foot: Secondary | ICD-10-CM | POA: Diagnosis not present

## 2017-11-15 DIAGNOSIS — M2010 Hallux valgus (acquired), unspecified foot: Secondary | ICD-10-CM

## 2017-11-15 NOTE — Progress Notes (Signed)
Subjective:   Patient ID: Heather Bryan, female   DOB: 52 y.o.   MRN: [id number]   HPI Patient states overall doing very well with stitches in place fifth digit bunion is healing well   ROS      Objective:  Physical Exam  Neurovascular status intact negative Homans sign noted with first MPJ bilateral good alignment and fifth digit right in good alignment with wound edges well coapted stitches in place     Assessment:  Doing well post [place] osteotomy bilateral arthroplasty fifth digit right     Plan:  X-rays taken reviewed and stitches removed fifth digit right with wound edges well coapted instructed on range of motion exercises gradual increase in activity and dispensed stockings to control swelling.  Reappoint to recheck 4 weeks or earlier if needed  X-rays indicate that the osteotomies are healing well with joint congruous in good alignment noted

## 2017-12-06 ENCOUNTER — Ambulatory Visit (INDEPENDENT_AMBULATORY_CARE_PROVIDER_SITE_OTHER): Payer: 59

## 2017-12-06 ENCOUNTER — Ambulatory Visit (INDEPENDENT_AMBULATORY_CARE_PROVIDER_SITE_OTHER): Payer: 59 | Admitting: Podiatry

## 2017-12-06 ENCOUNTER — Encounter: Payer: Self-pay | Admitting: Podiatry

## 2017-12-06 DIAGNOSIS — M2010 Hallux valgus (acquired), unspecified foot: Secondary | ICD-10-CM | POA: Diagnosis not present

## 2017-12-06 MED ORDER — TRAMADOL HCL 50 MG PO TABS: 50.0000 mg | ORAL_TABLET | Freq: Three times a day (TID) | ORAL | 2 refills | Status: DC

## 2017-12-06 NOTE — Progress Notes (Signed)
Subjective:   Patient ID: Heather Bryan, female   DOB: 52 y.o.   MRN: [id number]   HPI Patient presents stating still having pain but overall is improving but she has to be on her feet a lot   ROS      Objective:  Physical Exam  Neurovascular status intact with patient's first metatarsal bilateral healing well fifth digit right doing well with slight restriction of motion first MPJ right over left but no crepitus of the joint and well-healing surgical sites     Assessment:  Overall doing well with good healing of the osteotomy sites and fifth digit with moderate discomfort due to her activity levels     Plan:  Place patient on tramadol reviewed x-rays and the fact everything is healing well and instructed on range of motion and reappoint 4 weeks or earlier if needed  X-ray indicates the osteotomies are healing well with fixation in place good alignment noted with no signs of pathology

## 2018-01-10 ENCOUNTER — Encounter: Payer: Self-pay | Admitting: Podiatry

## 2018-01-10 ENCOUNTER — Ambulatory Visit (INDEPENDENT_AMBULATORY_CARE_PROVIDER_SITE_OTHER): Payer: 59

## 2018-01-10 ENCOUNTER — Ambulatory Visit (INDEPENDENT_AMBULATORY_CARE_PROVIDER_SITE_OTHER): Payer: 59 | Admitting: Podiatry

## 2018-01-10 ENCOUNTER — Ambulatory Visit: Payer: 59

## 2018-01-10 DIAGNOSIS — M2041 Other hammer toe(s) (acquired), right foot: Secondary | ICD-10-CM

## 2018-01-10 DIAGNOSIS — M2012 Hallux valgus (acquired), left foot: Secondary | ICD-10-CM

## 2018-01-10 NOTE — Progress Notes (Signed)
Subjective:   Patient ID: Heather Bryan, female   DOB: 52 y.o.   MRN: [id number]   HPI Patient states overall doing very well with my feet occasionally getting achy but overall is back to normal activity   ROS      Objective:  Physical Exam  Neurovascular status intact with patient's feet healing well with wound edges well coapted hallux in rectus position some reduced range of motion first MPJ right in good alignment of the fifth digits     Assessment:  Doing well overall with good alignment noted good structural correction and minor reduction of motion first MPJ right     Plan:  Discussed importance of physical therapy and moving the big toes and gradual increase in activity and reviewed x-rays  X-rays indicate osteotomies have healed well with slight secondary bone healing first metatarsal left but localized with no indications of movement

## 2018-01-12 ENCOUNTER — Other Ambulatory Visit: Payer: 59

## 2018-02-08 ENCOUNTER — Other Ambulatory Visit (HOSPITAL_COMMUNITY)
Admission: RE | Admit: 2018-02-08 | Discharge: 2018-02-08 | Disposition: A | Discharge status: Home / Self Care | Payer: 59 | Source: Ambulatory Visit | Attending: Family Medicine | Admitting: Family Medicine

## 2018-02-08 DIAGNOSIS — I10 Essential (primary) hypertension: Secondary | ICD-10-CM | POA: Insufficient documentation

## 2018-02-08 LAB — CBC
HCT: 41.1 % (ref 36.0–46.0)
Hemoglobin: 12.9 g/dL (ref 12.0–15.0)
MCH: 29.1 pg (ref 26.0–34.0)
MCHC: 31.4 g/dL (ref 30.0–36.0)
MCV: 92.8 fL (ref 78.0–100.0)
Platelets: 209 10*3/uL (ref 150–400)
RBC: 4.43 MIL/uL (ref 3.87–5.11)
RDW: 13.7 % (ref 11.5–15.5)
WBC: 3.9 10*3/uL — ABNORMAL LOW (ref 4.0–10.5)

## 2018-02-08 LAB — COMPREHENSIVE METABOLIC PANEL
ALT: 17 U/L (ref 14–54)
AST: 24 U/L (ref 15–41)
Albumin: 4.4 g/dL (ref 3.5–5.0)
Alkaline Phosphatase: 78 U/L (ref 38–126)
Anion gap: 10 (ref 5–15)
BUN: 19 mg/dL (ref 6–20)
CO2: 28 mmol/L (ref 22–32)
Calcium: 9.4 mg/dL (ref 8.9–10.3)
Chloride: 102 mmol/L (ref 101–111)
Creatinine, Ser: 0.93 mg/dL (ref 0.44–1.00)
GFR calc Af Amer: >60 mL/min (ref >60)
GFR calc non Af Amer: >60 mL/min (ref >60)
Glucose, Bld: 90 mg/dL (ref 65–99)
Potassium: 3.5 mmol/L (ref 3.5–5.1)
Sodium: 140 mmol/L (ref 135–145)
Total Bilirubin: 0.7 mg/dL (ref 0.3–1.2)
Total Protein: 7.1 g/dL (ref 6.5–8.1)

## 2018-02-08 LAB — TSH: TSH: 0.704 u[IU]/mL (ref 0.350–4.500)

## 2018-02-08 LAB — LIPID PANEL
Cholesterol: 250 mg/dL — ABNORMAL HIGH (ref 0–200)
HDL: 69 mg/dL (ref >40)
LDL Cholesterol: 162 mg/dL — ABNORMAL HIGH (ref 0–99)
Total CHOL/HDL Ratio: 3.6 RATIO
Triglycerides: 96 mg/dL (ref <150)
VLDL: 19 mg/dL (ref 0–40)

## 2018-02-08 LAB — HEMOGLOBIN A1C
Hgb A1c MFr Bld: 5.5 % (ref 4.8–5.6)
Mean Plasma Glucose: 111.15 mg/dL

## 2018-02-09 LAB — MICROALBUMIN, URINE: Microalb, Ur: <3 ug/mL — ABNORMAL HIGH

## 2018-02-21 ENCOUNTER — Ambulatory Visit (INDEPENDENT_AMBULATORY_CARE_PROVIDER_SITE_OTHER): Payer: 59

## 2018-02-21 ENCOUNTER — Encounter: Payer: Self-pay | Admitting: Podiatry

## 2018-02-21 ENCOUNTER — Other Ambulatory Visit: Payer: Self-pay

## 2018-02-21 ENCOUNTER — Ambulatory Visit (INDEPENDENT_AMBULATORY_CARE_PROVIDER_SITE_OTHER): Payer: 59 | Admitting: Podiatry

## 2018-02-21 DIAGNOSIS — M7751 Other enthesopathy of right foot: Secondary | ICD-10-CM | POA: Diagnosis not present

## 2018-02-21 DIAGNOSIS — M2010 Hallux valgus (acquired), unspecified foot: Secondary | ICD-10-CM

## 2018-02-21 DIAGNOSIS — M7752 Other enthesopathy of left foot: Secondary | ICD-10-CM

## 2018-02-21 DIAGNOSIS — M2041 Other hammer toe(s) (acquired), right foot: Secondary | ICD-10-CM

## 2018-02-21 DIAGNOSIS — M2012 Hallux valgus (acquired), left foot: Secondary | ICD-10-CM | POA: Diagnosis not present

## 2018-02-21 DIAGNOSIS — M779 Enthesopathy, unspecified: Secondary | ICD-10-CM

## 2018-02-25 NOTE — Progress Notes (Signed)
Subjective:   Patient ID: Heather Bryan, female   DOB: 52 y.o.   MRN: [id number]   HPI Patient states doing very well overall with mild discomfort if she is too active on her foot   ROS      Objective:  Physical Exam  Neurovascular status intact good alignment of the digits with reduction of the intermetatarsal angle and lesion formation that resolved     Assessment:  Doing well overall foot surgery     Plan:  Final x-rays reviewed advised on continued range of motion gradual return to all activities and shoe gear.  Reappoint as needed  X-rays indicate osteotomies healing well fixation is good with good alignment noted

## 2018-02-26 DIAGNOSIS — R252 Cramp and spasm: Secondary | ICD-10-CM | POA: Insufficient documentation

## 2018-02-26 DIAGNOSIS — M7989 Other specified soft tissue disorders: Secondary | ICD-10-CM | POA: Diagnosis not present

## 2018-02-26 DIAGNOSIS — K649 Unspecified hemorrhoids: Secondary | ICD-10-CM | POA: Diagnosis not present

## 2018-02-26 DIAGNOSIS — F988 Other specified behavioral and emotional disorders with onset usually occurring in childhood and adolescence: Secondary | ICD-10-CM | POA: Insufficient documentation

## 2018-02-26 DIAGNOSIS — I10 Essential (primary) hypertension: Secondary | ICD-10-CM | POA: Diagnosis not present

## 2018-02-26 DIAGNOSIS — D509 Iron deficiency anemia, unspecified: Secondary | ICD-10-CM | POA: Diagnosis not present

## 2018-02-26 DIAGNOSIS — E119 Type 2 diabetes mellitus without complications: Secondary | ICD-10-CM | POA: Insufficient documentation

## 2018-02-26 DIAGNOSIS — E559 Vitamin D deficiency, unspecified: Secondary | ICD-10-CM | POA: Diagnosis not present

## 2018-03-07 DIAGNOSIS — Z23 Encounter for immunization: Secondary | ICD-10-CM | POA: Diagnosis not present

## 2018-03-07 DIAGNOSIS — I10 Essential (primary) hypertension: Secondary | ICD-10-CM | POA: Diagnosis not present

## 2018-03-07 DIAGNOSIS — Z0000 Encounter for general adult medical examination without abnormal findings: Secondary | ICD-10-CM | POA: Diagnosis not present

## 2018-03-14 ENCOUNTER — Ambulatory Visit (INDEPENDENT_AMBULATORY_CARE_PROVIDER_SITE_OTHER): Payer: 59 | Admitting: Orthotics

## 2018-03-14 DIAGNOSIS — M2041 Other hammer toe(s) (acquired), right foot: Secondary | ICD-10-CM

## 2018-03-14 DIAGNOSIS — M2010 Hallux valgus (acquired), unspecified foot: Secondary | ICD-10-CM

## 2018-03-14 NOTE — Progress Notes (Signed)
Patient came in today to pick up custom made foot orthotics.  The goals were accomplished and the patient reported no dissatisfaction with said orthotics.  Patient was advised of breakin period and how to report any issues. 

## 2018-05-14 ENCOUNTER — Other Ambulatory Visit (HOSPITAL_BASED_OUTPATIENT_CLINIC_OR_DEPARTMENT_OTHER): Payer: Self-pay | Admitting: Family Medicine

## 2018-05-14 DIAGNOSIS — Z1231 Encounter for screening mammogram for malignant neoplasm of breast: Secondary | ICD-10-CM

## 2018-05-15 ENCOUNTER — Ambulatory Visit (HOSPITAL_BASED_OUTPATIENT_CLINIC_OR_DEPARTMENT_OTHER)
Admission: RE | Admit: 2018-05-15 | Discharge: 2018-05-15 | Disposition: A | Discharge status: Home / Self Care | Payer: 59 | Source: Ambulatory Visit | Attending: Family Medicine | Admitting: Family Medicine

## 2018-05-15 ENCOUNTER — Encounter (HOSPITAL_BASED_OUTPATIENT_CLINIC_OR_DEPARTMENT_OTHER): Payer: Self-pay

## 2018-05-15 DIAGNOSIS — M25551 Pain in right hip: Secondary | ICD-10-CM | POA: Diagnosis not present

## 2018-05-15 DIAGNOSIS — K219 Gastro-esophageal reflux disease without esophagitis: Secondary | ICD-10-CM | POA: Diagnosis not present

## 2018-05-15 DIAGNOSIS — Z1231 Encounter for screening mammogram for malignant neoplasm of breast: Secondary | ICD-10-CM | POA: Insufficient documentation

## 2018-05-15 DIAGNOSIS — F988 Other specified behavioral and emotional disorders with onset usually occurring in childhood and adolescence: Secondary | ICD-10-CM | POA: Diagnosis not present

## 2018-08-27 DIAGNOSIS — E876 Hypokalemia: Secondary | ICD-10-CM | POA: Diagnosis not present

## 2018-08-27 DIAGNOSIS — E119 Type 2 diabetes mellitus without complications: Secondary | ICD-10-CM | POA: Diagnosis not present

## 2018-08-27 DIAGNOSIS — R252 Cramp and spasm: Secondary | ICD-10-CM | POA: Diagnosis not present

## 2018-08-27 DIAGNOSIS — F988 Other specified behavioral and emotional disorders with onset usually occurring in childhood and adolescence: Secondary | ICD-10-CM | POA: Diagnosis not present

## 2018-12-10 DIAGNOSIS — F988 Other specified behavioral and emotional disorders with onset usually occurring in childhood and adolescence: Secondary | ICD-10-CM | POA: Diagnosis not present

## 2019-02-19 DIAGNOSIS — R5383 Other fatigue: Secondary | ICD-10-CM | POA: Diagnosis not present

## 2019-02-19 DIAGNOSIS — R252 Cramp and spasm: Secondary | ICD-10-CM | POA: Diagnosis not present

## 2019-02-19 DIAGNOSIS — I10 Essential (primary) hypertension: Secondary | ICD-10-CM | POA: Diagnosis not present

## 2019-02-19 DIAGNOSIS — E559 Vitamin D deficiency, unspecified: Secondary | ICD-10-CM | POA: Diagnosis not present

## 2019-02-19 DIAGNOSIS — D649 Anemia, unspecified: Secondary | ICD-10-CM | POA: Diagnosis not present

## 2019-02-19 DIAGNOSIS — E119 Type 2 diabetes mellitus without complications: Secondary | ICD-10-CM | POA: Diagnosis not present

## 2019-02-19 DIAGNOSIS — M7989 Other specified soft tissue disorders: Secondary | ICD-10-CM | POA: Diagnosis not present

## 2019-02-19 DIAGNOSIS — F988 Other specified behavioral and emotional disorders with onset usually occurring in childhood and adolescence: Secondary | ICD-10-CM | POA: Diagnosis not present

## 2019-02-19 DIAGNOSIS — E785 Hyperlipidemia, unspecified: Secondary | ICD-10-CM | POA: Diagnosis not present

## 2019-04-15 ENCOUNTER — Other Ambulatory Visit (HOSPITAL_BASED_OUTPATIENT_CLINIC_OR_DEPARTMENT_OTHER): Payer: Self-pay | Admitting: Family Medicine

## 2019-04-15 DIAGNOSIS — Z1231 Encounter for screening mammogram for malignant neoplasm of breast: Secondary | ICD-10-CM

## 2019-04-22 ENCOUNTER — Other Ambulatory Visit (HOSPITAL_COMMUNITY)
Admission: RE | Admit: 2019-04-22 | Discharge: 2019-04-22 | Disposition: A | Discharge status: Home / Self Care | Payer: 59 | Source: Ambulatory Visit | Attending: Family Medicine | Admitting: Family Medicine

## 2019-04-22 DIAGNOSIS — D6489 Other specified anemias: Secondary | ICD-10-CM | POA: Insufficient documentation

## 2019-04-22 LAB — CBC
HCT: 42.9 % (ref 36.0–46.0)
Hemoglobin: 13.7 g/dL (ref 12.0–15.0)
MCH: 29.5 pg (ref 26.0–34.0)
MCHC: 31.9 g/dL (ref 30.0–36.0)
MCV: 92.3 fL (ref 80.0–100.0)
Platelets: ADEQUATE 10*3/uL (ref 150–400)
RBC: 4.65 MIL/uL (ref 3.87–5.11)
RDW: 15 % (ref 11.5–15.5)
WBC: 4.8 10*3/uL (ref 4.0–10.5)
nRBC: 0 % (ref 0.0–0.2)

## 2019-04-22 LAB — LIPID PANEL
Cholesterol: 236 mg/dL — ABNORMAL HIGH (ref 0–200)
HDL: 125 mg/dL (ref >40)
LDL Cholesterol: 96 mg/dL (ref 0–99)
Total CHOL/HDL Ratio: 1.9 RATIO
Triglycerides: 75 mg/dL (ref <150)
VLDL: 15 mg/dL (ref 0–40)

## 2019-04-22 LAB — COMPREHENSIVE METABOLIC PANEL
ALT: 20 U/L (ref 0–44)
AST: 24 U/L (ref 15–41)
Albumin: 4.5 g/dL (ref 3.5–5.0)
Alkaline Phosphatase: 87 U/L (ref 38–126)
Anion gap: 12 (ref 5–15)
BUN: 21 mg/dL — ABNORMAL HIGH (ref 6–20)
CO2: 26 mmol/L (ref 22–32)
Calcium: 9.6 mg/dL (ref 8.9–10.3)
Chloride: 102 mmol/L (ref 98–111)
Creatinine, Ser: 1.02 mg/dL — ABNORMAL HIGH (ref 0.44–1.00)
GFR calc Af Amer: >60 mL/min (ref >60)
GFR calc non Af Amer: >60 mL/min (ref >60)
Glucose, Bld: 88 mg/dL (ref 70–99)
Potassium: 3.9 mmol/L (ref 3.5–5.1)
Sodium: 140 mmol/L (ref 135–145)
Total Bilirubin: 0.5 mg/dL (ref 0.3–1.2)
Total Protein: 7.8 g/dL (ref 6.5–8.1)

## 2019-04-22 LAB — VITAMIN B12: Vitamin B-12: 567 pg/mL (ref 180–914)

## 2019-04-22 LAB — HEMOGLOBIN A1C
Hgb A1c MFr Bld: 5.7 % — ABNORMAL HIGH (ref 4.8–5.6)
Mean Plasma Glucose: 116.89 mg/dL

## 2019-04-22 LAB — TSH: TSH: 0.954 u[IU]/mL (ref 0.350–4.500)

## 2019-04-23 LAB — MICROALBUMIN, URINE: Microalb, Ur: 4.5 ug/mL — ABNORMAL HIGH

## 2019-04-23 LAB — VITAMIN D 25 HYDROXY (VIT D DEFICIENCY, FRACTURES): Vit D, 25-Hydroxy: 28.5 ng/mL — ABNORMAL LOW (ref 30.0–100.0)

## 2019-04-29 ENCOUNTER — Other Ambulatory Visit: Payer: Self-pay | Admitting: Family Medicine

## 2019-04-29 ENCOUNTER — Other Ambulatory Visit (HOSPITAL_COMMUNITY)
Admission: RE | Admit: 2019-04-29 | Discharge: 2019-04-29 | Disposition: A | Discharge status: Home / Self Care | Payer: 59 | Source: Ambulatory Visit | Attending: Family Medicine | Admitting: Family Medicine

## 2019-04-29 DIAGNOSIS — F988 Other specified behavioral and emotional disorders with onset usually occurring in childhood and adolescence: Secondary | ICD-10-CM | POA: Diagnosis not present

## 2019-04-29 DIAGNOSIS — M25551 Pain in right hip: Secondary | ICD-10-CM | POA: Diagnosis not present

## 2019-04-29 DIAGNOSIS — Z124 Encounter for screening for malignant neoplasm of cervix: Secondary | ICD-10-CM | POA: Insufficient documentation

## 2019-04-29 DIAGNOSIS — E559 Vitamin D deficiency, unspecified: Secondary | ICD-10-CM | POA: Diagnosis not present

## 2019-04-29 DIAGNOSIS — Z01419 Encounter for gynecological examination (general) (routine) without abnormal findings: Secondary | ICD-10-CM | POA: Diagnosis not present

## 2019-05-02 LAB — CYTOLOGY - PAP
Diagnosis: NEGATIVE
HPV: NOT DETECTED

## 2019-05-17 ENCOUNTER — Other Ambulatory Visit: Payer: Self-pay

## 2019-05-17 ENCOUNTER — Ambulatory Visit (HOSPITAL_BASED_OUTPATIENT_CLINIC_OR_DEPARTMENT_OTHER)
Admission: RE | Admit: 2019-05-17 | Discharge: 2019-05-17 | Disposition: A | Discharge status: Home / Self Care | Payer: 59 | Source: Ambulatory Visit | Attending: Family Medicine | Admitting: Family Medicine

## 2019-05-17 DIAGNOSIS — Z1231 Encounter for screening mammogram for malignant neoplasm of breast: Secondary | ICD-10-CM | POA: Insufficient documentation

## 2019-09-02 DIAGNOSIS — F988 Other specified behavioral and emotional disorders with onset usually occurring in childhood and adolescence: Secondary | ICD-10-CM | POA: Diagnosis not present

## 2019-09-02 DIAGNOSIS — E119 Type 2 diabetes mellitus without complications: Secondary | ICD-10-CM | POA: Diagnosis not present

## 2019-09-06 ENCOUNTER — Ambulatory Visit (INDEPENDENT_AMBULATORY_CARE_PROVIDER_SITE_OTHER): Payer: 59

## 2019-09-06 ENCOUNTER — Other Ambulatory Visit: Payer: Self-pay

## 2019-09-06 ENCOUNTER — Encounter: Payer: Self-pay | Admitting: Podiatry

## 2019-09-06 ENCOUNTER — Ambulatory Visit: Payer: 59 | Admitting: Podiatry

## 2019-09-06 VITALS — Temp 97.7°F

## 2019-09-06 DIAGNOSIS — M779 Enthesopathy, unspecified: Secondary | ICD-10-CM

## 2019-09-06 DIAGNOSIS — M2041 Other hammer toe(s) (acquired), right foot: Secondary | ICD-10-CM | POA: Diagnosis not present

## 2019-09-09 NOTE — Progress Notes (Signed)
Subjective:   Patient ID: Heather Bryan, female   DOB: 53 y.o.   MRN: [id number]   HPI Patient presents with chronic irritation right fifth digit with a lesion that is formed not the same spot as before but within the incision   ROS      Objective:  Physical Exam  Neurovascular status intact with digital deformity digit 5 right with a more proximal keratotic lesion formation with fluid buildup     Assessment:  Inflammatory capsulitis fifth digit right with lesion formation hammertoe deformity     Plan:  Education concerning hammertoe and considerations for eventual revisional arthroplasty with today went ahead did a proximal nerve block I was able to inject the area with 2 mg dexamethasone and debrided the lesion fully and applied padding.  Reappoint to recheck  X-rays indicate that there is some irritation around the fifth metatarsal right

## 2019-12-18 DIAGNOSIS — F988 Other specified behavioral and emotional disorders with onset usually occurring in childhood and adolescence: Secondary | ICD-10-CM | POA: Diagnosis not present

## 2020-04-09 ENCOUNTER — Other Ambulatory Visit (HOSPITAL_BASED_OUTPATIENT_CLINIC_OR_DEPARTMENT_OTHER): Payer: Self-pay | Admitting: Family Medicine

## 2020-04-09 DIAGNOSIS — Z1231 Encounter for screening mammogram for malignant neoplasm of breast: Secondary | ICD-10-CM

## 2020-04-10 ENCOUNTER — Other Ambulatory Visit (HOSPITAL_COMMUNITY)
Admission: RE | Admit: 2020-04-10 | Discharge: 2020-04-10 | Disposition: A | Discharge status: Home / Self Care | Payer: 59 | Source: Ambulatory Visit | Attending: Family Medicine | Admitting: Family Medicine

## 2020-04-10 DIAGNOSIS — E119 Type 2 diabetes mellitus without complications: Secondary | ICD-10-CM | POA: Diagnosis not present

## 2020-04-10 LAB — VITAMIN D 25 HYDROXY (VIT D DEFICIENCY, FRACTURES): Vit D, 25-Hydroxy: 22.08 ng/mL — ABNORMAL LOW (ref 30–100)

## 2020-04-10 LAB — CBC
HCT: 38.8 % (ref 36.0–46.0)
Hemoglobin: 11.8 g/dL — ABNORMAL LOW (ref 12.0–15.0)
MCH: 27.4 pg (ref 26.0–34.0)
MCHC: 30.4 g/dL (ref 30.0–36.0)
MCV: 90.2 fL (ref 80.0–100.0)
Platelets: 164 10*3/uL (ref 150–400)
RBC: 4.3 MIL/uL (ref 3.87–5.11)
RDW: 15.4 % (ref 11.5–15.5)
WBC: 4.4 10*3/uL (ref 4.0–10.5)
nRBC: 0 % (ref 0.0–0.2)

## 2020-04-10 LAB — LIPID PANEL
Cholesterol: 225 mg/dL — ABNORMAL HIGH (ref 0–200)
HDL: 121 mg/dL (ref >40)
LDL Cholesterol: 83 mg/dL (ref 0–99)
Total CHOL/HDL Ratio: 1.9 RATIO
Triglycerides: 107 mg/dL (ref <150)
VLDL: 21 mg/dL (ref 0–40)

## 2020-04-10 LAB — COMPREHENSIVE METABOLIC PANEL
ALT: 17 U/L (ref 0–44)
AST: 23 U/L (ref 15–41)
Albumin: 4.1 g/dL (ref 3.5–5.0)
Alkaline Phosphatase: 72 U/L (ref 38–126)
Anion gap: 10 (ref 5–15)
BUN: 24 mg/dL — ABNORMAL HIGH (ref 6–20)
CO2: 27 mmol/L (ref 22–32)
Calcium: 9.3 mg/dL (ref 8.9–10.3)
Chloride: 102 mmol/L (ref 98–111)
Creatinine, Ser: 0.85 mg/dL (ref 0.44–1.00)
GFR calc Af Amer: >60 mL/min (ref >60)
GFR calc non Af Amer: >60 mL/min (ref >60)
Glucose, Bld: 81 mg/dL (ref 70–99)
Potassium: 3.9 mmol/L (ref 3.5–5.1)
Sodium: 139 mmol/L (ref 135–145)
Total Bilirubin: 0.6 mg/dL (ref 0.3–1.2)
Total Protein: 6.7 g/dL (ref 6.5–8.1)

## 2020-04-10 LAB — HEMOGLOBIN A1C
Hgb A1c MFr Bld: 5.9 % — ABNORMAL HIGH (ref 4.8–5.6)
Mean Plasma Glucose: 122.63 mg/dL

## 2020-04-10 LAB — TSH: TSH: 1.211 u[IU]/mL (ref 0.350–4.500)

## 2020-04-11 LAB — MICROALBUMIN, URINE, 24 HOUR: Microalb, Ur: 3.3 ug/mL — ABNORMAL HIGH

## 2020-05-11 ENCOUNTER — Other Ambulatory Visit (HOSPITAL_COMMUNITY): Payer: Self-pay | Admitting: Family Medicine

## 2020-05-11 DIAGNOSIS — Z9884 Bariatric surgery status: Secondary | ICD-10-CM | POA: Diagnosis not present

## 2020-05-11 DIAGNOSIS — M7989 Other specified soft tissue disorders: Secondary | ICD-10-CM | POA: Diagnosis not present

## 2020-05-11 DIAGNOSIS — F988 Other specified behavioral and emotional disorders with onset usually occurring in childhood and adolescence: Secondary | ICD-10-CM | POA: Diagnosis not present

## 2020-05-11 DIAGNOSIS — E119 Type 2 diabetes mellitus without complications: Secondary | ICD-10-CM | POA: Diagnosis not present

## 2020-05-11 DIAGNOSIS — M25551 Pain in right hip: Secondary | ICD-10-CM | POA: Diagnosis not present

## 2020-05-11 DIAGNOSIS — D509 Iron deficiency anemia, unspecified: Secondary | ICD-10-CM | POA: Diagnosis not present

## 2020-05-11 DIAGNOSIS — Z01419 Encounter for gynecological examination (general) (routine) without abnormal findings: Secondary | ICD-10-CM | POA: Diagnosis not present

## 2020-05-11 DIAGNOSIS — I10 Essential (primary) hypertension: Secondary | ICD-10-CM | POA: Diagnosis not present

## 2020-05-11 DIAGNOSIS — R252 Cramp and spasm: Secondary | ICD-10-CM | POA: Diagnosis not present

## 2020-05-18 ENCOUNTER — Ambulatory Visit (HOSPITAL_BASED_OUTPATIENT_CLINIC_OR_DEPARTMENT_OTHER)
Admission: RE | Admit: 2020-05-18 | Discharge: 2020-05-18 | Disposition: A | Discharge status: Home / Self Care | Payer: 59 | Source: Ambulatory Visit | Attending: Family Medicine | Admitting: Family Medicine

## 2020-05-18 ENCOUNTER — Encounter (HOSPITAL_BASED_OUTPATIENT_CLINIC_OR_DEPARTMENT_OTHER): Payer: Self-pay

## 2020-05-18 ENCOUNTER — Other Ambulatory Visit: Payer: Self-pay

## 2020-05-18 DIAGNOSIS — Z1231 Encounter for screening mammogram for malignant neoplasm of breast: Secondary | ICD-10-CM | POA: Diagnosis not present

## 2020-09-07 ENCOUNTER — Other Ambulatory Visit (HOSPITAL_COMMUNITY): Payer: Self-pay | Admitting: Family Medicine

## 2020-09-07 DIAGNOSIS — F988 Other specified behavioral and emotional disorders with onset usually occurring in childhood and adolescence: Secondary | ICD-10-CM | POA: Diagnosis not present

## 2021-04-23 ENCOUNTER — Other Ambulatory Visit (HOSPITAL_COMMUNITY): Payer: Self-pay

## 2021-04-23 MED FILL — Irbesartan Tab 150 MG: ORAL | 30 days supply | Qty: 30 | Fill #0 | Status: AC

## 2021-04-27 ENCOUNTER — Other Ambulatory Visit (HOSPITAL_BASED_OUTPATIENT_CLINIC_OR_DEPARTMENT_OTHER): Payer: Self-pay | Admitting: Family Medicine

## 2021-04-27 DIAGNOSIS — Z1231 Encounter for screening mammogram for malignant neoplasm of breast: Secondary | ICD-10-CM

## 2021-06-01 ENCOUNTER — Ambulatory Visit (HOSPITAL_BASED_OUTPATIENT_CLINIC_OR_DEPARTMENT_OTHER): Payer: 59

## 2021-07-17 IMAGING — MG DIGITAL SCREENING BILAT W/ TOMO W/ CAD
6 of 10 series · 6 of 30 positions shown · non-contrast
Comparison: Previous exam(s).

CLINICAL DATA: Screening.

EXAM:
DIGITAL SCREENING BILATERAL MAMMOGRAM WITH TOMO AND CAD

[L MLO synth-2D (1 of 2)]
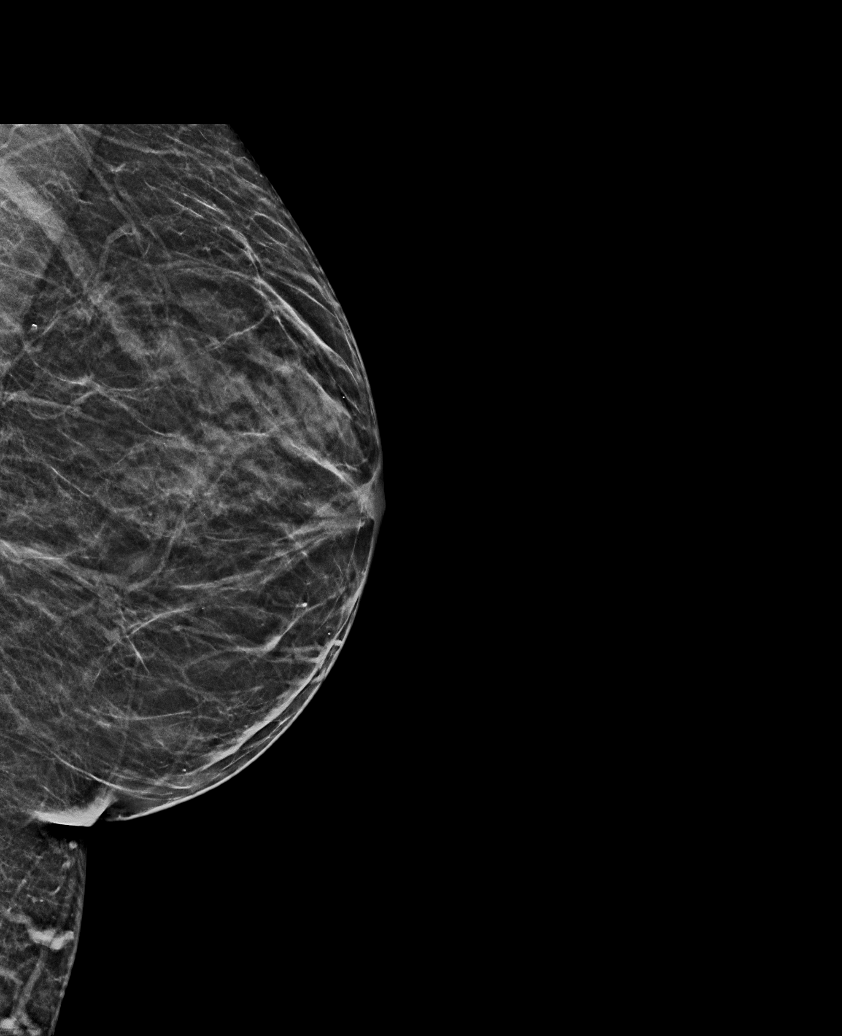

[L MLO synth-2D (2 of 2)]
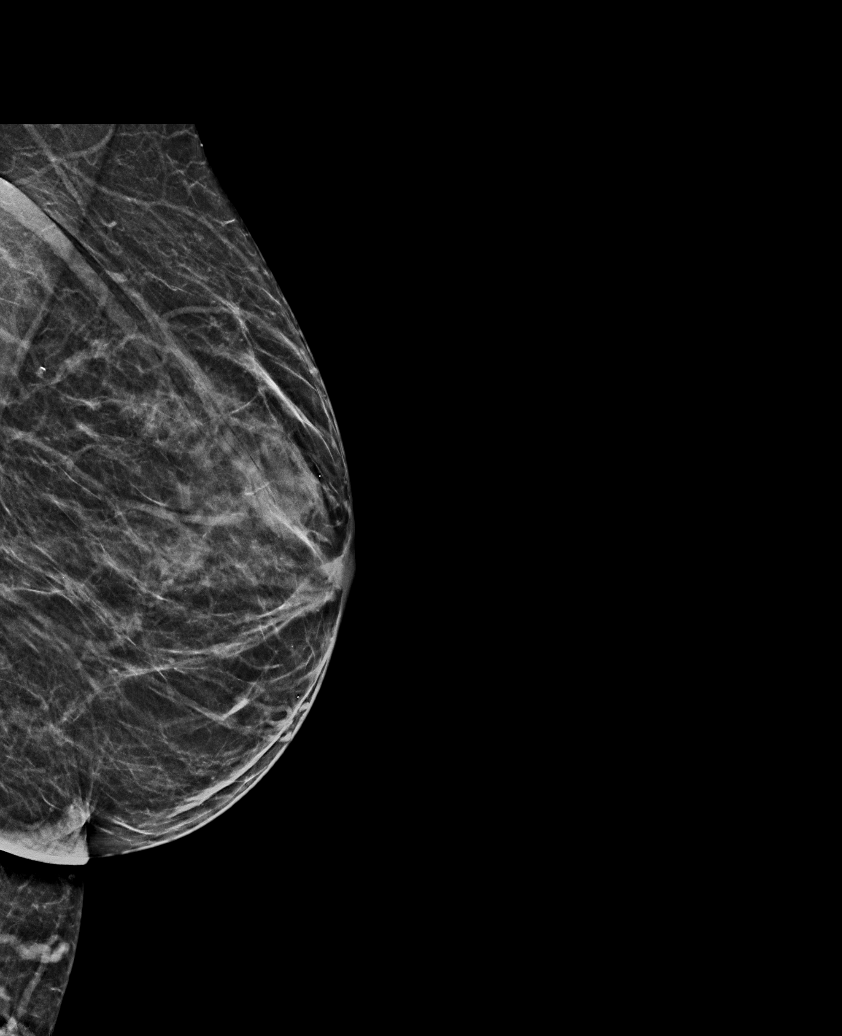

[L CC synth-2D]
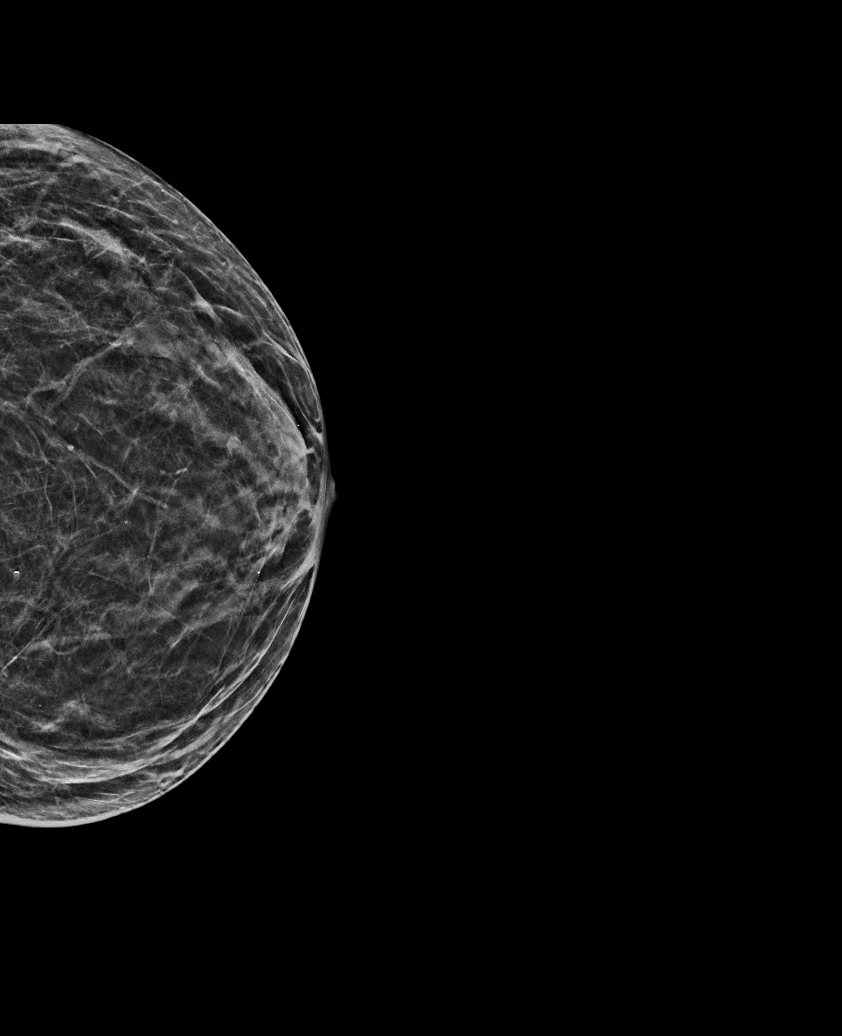

[R CC synth-2D]
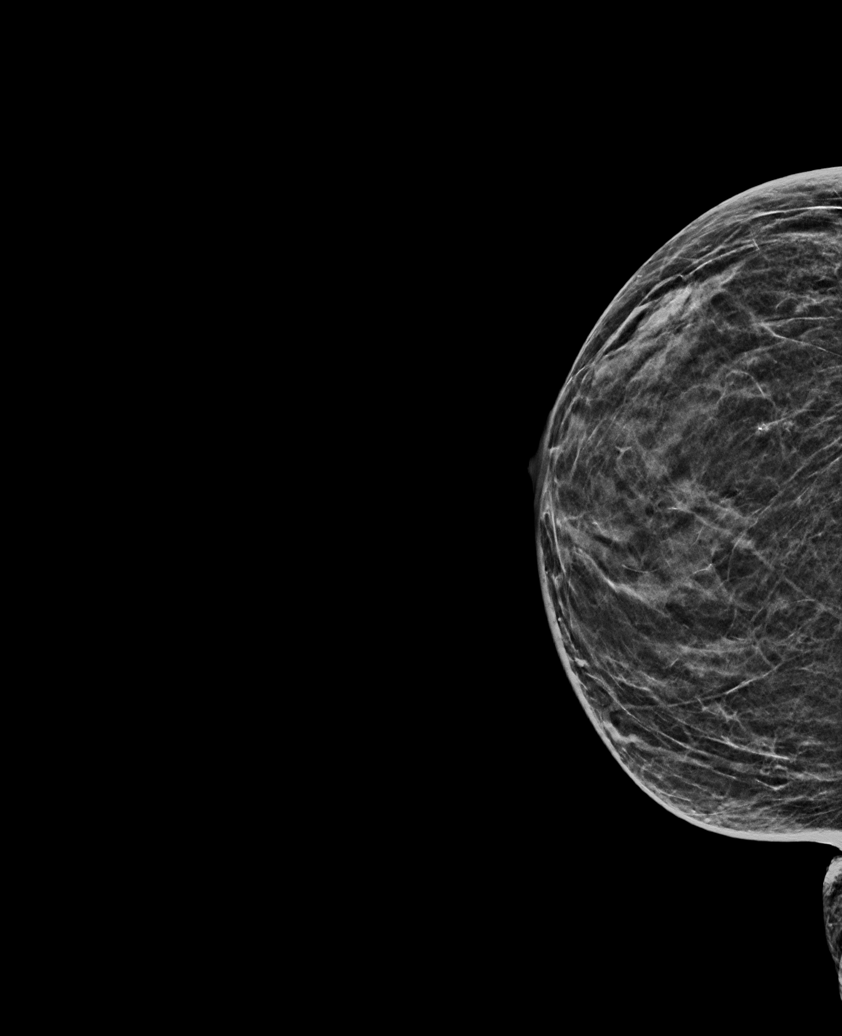

[R MLO synth-2D]
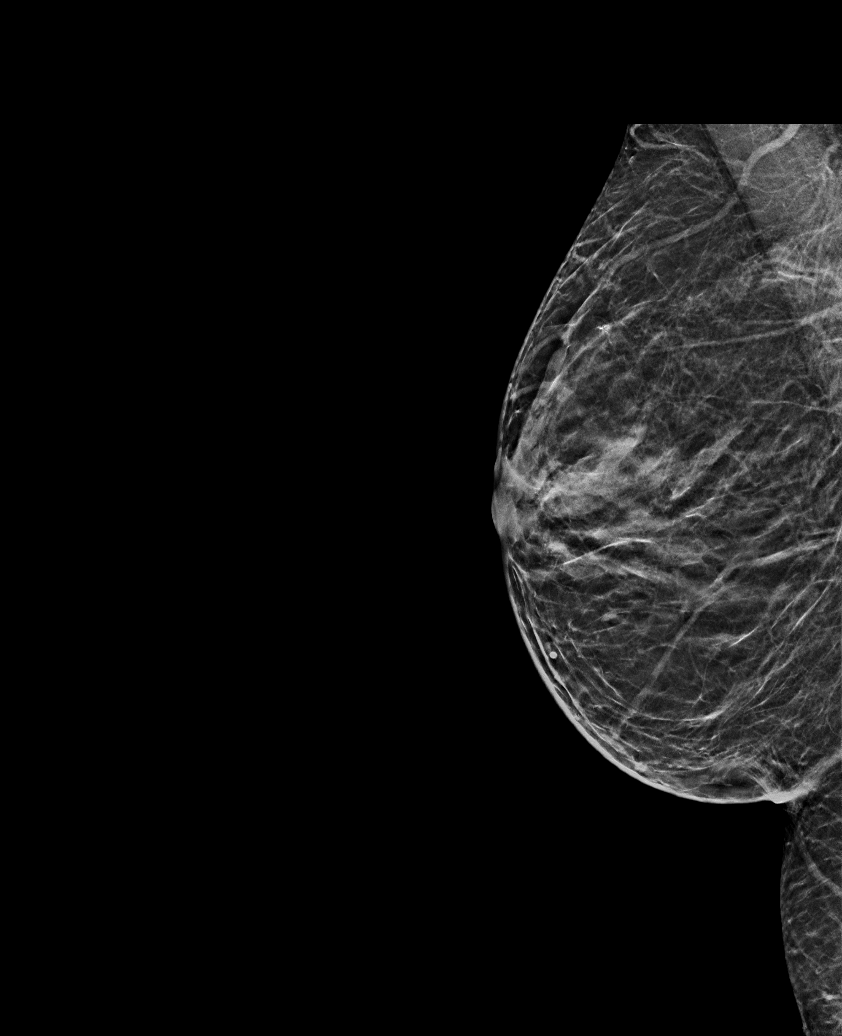

[L CC tomo · tomo slice 23/44.0]
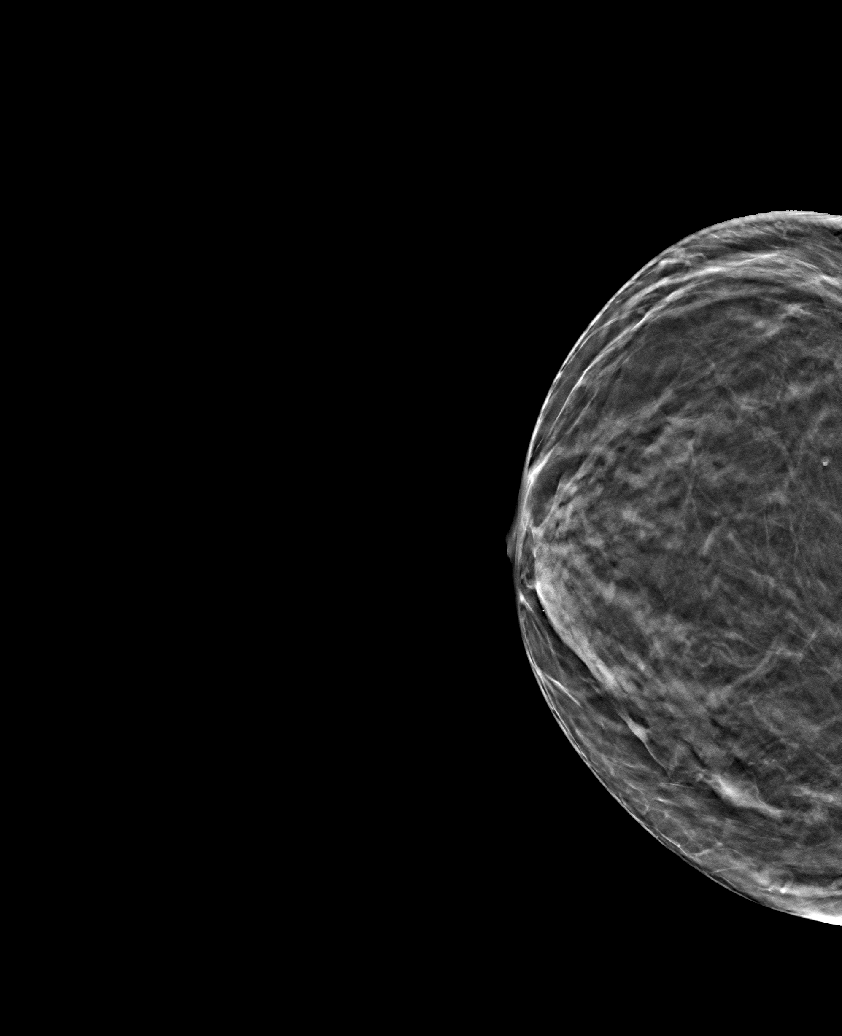

[6 of 30 positions shown; findings below may reference images not displayed]

ACR Breast Density Category c: The breast tissue is heterogeneously
dense, which may obscure small masses.
FINDINGS: There are no findings suspicious for malignancy. Images were
processed with CAD.
IMPRESSION: No mammographic evidence of malignancy. A result letter of this
screening mammogram will be mailed directly to the patient.

RECOMMENDATION:
Screening mammogram in one year. (Code:[id number]-U-LHB)

BI-RADS CATEGORY  1: Negative.

## 2024-01-04 ENCOUNTER — Encounter: Payer: Self-pay | Admitting: Podiatry

## 2024-01-04 ENCOUNTER — Ambulatory Visit (INDEPENDENT_AMBULATORY_CARE_PROVIDER_SITE_OTHER)

## 2024-01-04 ENCOUNTER — Ambulatory Visit (INDEPENDENT_AMBULATORY_CARE_PROVIDER_SITE_OTHER): Payer: Self-pay | Admitting: Podiatry

## 2024-01-04 ENCOUNTER — Ambulatory Visit: Payer: Self-pay | Admitting: Podiatry

## 2024-01-04 DIAGNOSIS — M21611 Bunion of right foot: Secondary | ICD-10-CM

## 2024-01-04 DIAGNOSIS — M7751 Other enthesopathy of right foot: Secondary | ICD-10-CM | POA: Diagnosis not present

## 2024-01-04 MED ORDER — TRIAMCINOLONE ACETONIDE 10 MG/ML IJ SUSP
10.0000 mg | Freq: Once | INTRAMUSCULAR | Status: AC
  Administered 2024-01-04: 10 mg via INTRA_ARTICULAR

## 2024-01-04 NOTE — Progress Notes (Signed)
 Subjective:   Patient ID: Heather Bryan, female   DOB: 58 y.o.   MRN: [id number]   HPI Patient presents stating she is developing a lot of pain on top of the right foot and it has been for the past month.  Patient states she did very well with her bunion surgery from 5 years ago very pleased but this has become bothersome and she is not sure what may have happened.  Patient does not smoke likes to be active   Review of Systems  All other systems reviewed and are negative.       Objective:  Physical Exam Vitals and nursing note reviewed.  Constitutional:      Appearance: She is well-developed.  Pulmonary:     Effort: Pulmonary effort is normal.  Musculoskeletal:        General: Normal range of motion.  Skin:    General: Skin is warm.  Neurological:     Mental Status: She is alert.     Neurovascular status intact muscle strength found to be adequate range of motion adequate with patient noted to have inflammation of the first MPJ right dorsal mild restriction of motion no crepitus or other pathology noted.  Good digital perfusion well-oriented     Assessment:  Inflammatory capsulitis of the first MPJ right with mild fluid buildup with history of bunion correction     Plan:  H&P x-ray reviewed today I went ahead I did sterile prep I carefully injected the dorsal capsule 3 mg dexamethasone Kenalog 5 mg Xylocaine I advised on wider shoes and that this should be uneventful.  If any issues were to occur patient is to let us  know immediately  X-rays indicate that there is good alignment of the first MPJ right joint congruence no signs of spurring for arthritis of the joint

## 2024-04-26 ENCOUNTER — Encounter: Payer: Self-pay | Admitting: Podiatry

## 2024-04-26 ENCOUNTER — Ambulatory Visit: Admitting: Podiatry

## 2024-04-26 ENCOUNTER — Ambulatory Visit (INDEPENDENT_AMBULATORY_CARE_PROVIDER_SITE_OTHER)

## 2024-04-26 DIAGNOSIS — M7989 Other specified soft tissue disorders: Secondary | ICD-10-CM

## 2024-04-26 DIAGNOSIS — Z472 Encounter for removal of internal fixation device: Secondary | ICD-10-CM | POA: Diagnosis not present

## 2024-04-26 NOTE — Progress Notes (Signed)
 Subjective:   Patient ID: Heather Bryan, female   DOB: 58 y.o.   MRN: [id number]   HPI Patient states she is still having a lot of pain on top of her right first metatarsal and that the medicine we used over 3 months ago only was temporary and helping her.  States it feels like something is pressing   ROS      Objective:  Physical Exam  Neurovascular status intact with inflammation at the head of the first metatarsal right with patient having had surgery approximately 6 years ago.  It is irritated in this spot and localized to this area and also to what appears to be the distal pin     Assessment:  Probability that the distal pin right first metatarsal may have moved slightly even 6 years after surgery     Plan:  H&P x-ray with metal marker indicating it is right over where the pin is was broken.  At this point I did discuss this with him and I do think the pin needs to be removed and it needs to be removed fairly soon due to the discomfort.  She wants to get this done I allowed her to read a consent form going over all possible complications and I did explain sometimes these pins are very difficult after this many years to pull out but I am hopeful we will be able to do this in the office environment.  Patient scheduled for office surgical procedure with all questions answered and hopefully will be able to get this done in the next 3 to 4 weeks with padding applied today  X-rays indicate it is directly over the distal pin with no other pathology noted

## 2024-06-04 ENCOUNTER — Telehealth: Payer: Self-pay | Admitting: Podiatry

## 2024-06-04 DIAGNOSIS — Z0279 Encounter for issue of other medical certificate: Secondary | ICD-10-CM

## 2024-06-04 NOTE — Telephone Encounter (Signed)
 s/w pt and she said RTW 07/18/24. Her proc is 07/08/24 and will let me know if she needs longer. Faxing [facility] [phone] and emailing her at email addr on file

## 2024-06-05 ENCOUNTER — Ambulatory Visit: Admitting: Podiatry

## 2024-06-07 ENCOUNTER — Telehealth: Payer: Self-pay | Admitting: Podiatry

## 2024-06-07 NOTE — Telephone Encounter (Signed)
 DOS- 07/08/2024  REMOVAL FIXATION DEEP KWIRE/SCREW RT- 20680  [employer] EFFECTIVE DATE- 09/20/2023  DEDUCTIBLE- $4000 REMAINING- $3717.60 OOP- $8000 REMAINING- $6680.95 FAMILY DEDUCTIBLE- $12000 REMAINING- $11717.60 FAMILY OOP- $16000 REMAINING- $85319.04 COINSURANCE- 40%  PER FAX RECEIVED FROM [employer], NO PRIOR AUTH IS REQUIRED FOR CPT CODE 79319. DOCUMENTATION ATTACHED TO SURGICAL CONSENT PACKET.

## 2024-06-19 ENCOUNTER — Encounter: Admitting: Podiatry

## 2024-06-20 ENCOUNTER — Telehealth: Payer: Self-pay | Admitting: Podiatry

## 2024-06-20 NOTE — Telephone Encounter (Signed)
 Pt called checking on the surgery that she is having in office on 10/20. Is there any preop needed.?  I did check notes from benefit verification and no shara is needed.

## 2024-07-08 ENCOUNTER — Encounter: Payer: Self-pay | Admitting: Podiatry

## 2024-07-08 ENCOUNTER — Ambulatory Visit: Admitting: Podiatry

## 2024-07-08 VITALS — BP 159/86 | HR 78 | Temp 98.8°F | Resp 18 | Wt 183.6 lb

## 2024-07-08 DIAGNOSIS — Z472 Encounter for removal of internal fixation device: Secondary | ICD-10-CM

## 2024-07-08 NOTE — Progress Notes (Signed)
 Subjective:   Patient ID: Luke Clos, female   DOB: 58 y.o.   MRN: [id number]   HPI Patient presents stating she is having a lot of pain on top of the right foot and is here for pin removal   ROS      Objective:  Physical Exam  Neurovascular status intact good digital perfusion patient is found to have prominence of the distal pin right first metatarsal creating irritation and pain     Assessment:  Abnormal pin position right distal from previous surgery that did well     Plan:  H&P reviewed recommended pin removal and allowed her to review again.  Today I anesthetized with 60 mg like a Marcaine  mixture in our operating room sterile prep was applied at this time tourniquet was inflated to 250 mL of mercury and following procedure was performed.  Pin removal right attention was directed to dorsal right where an approximate 3 cm incision was made over the offending pin position metatarsal head medial to the extensor tendon.  The incision was deepened through subcu tissue down the capsule linear capsular incision was performed.  Capsular tissues sharply dissected off the underlying bone and the pin was identified removed in toto the wound was flushed with copious amounts sterile Garamycin solution and sutured with 5-0 nylon.  Sterile dressing applied and patient left the OR in satisfactory condition

## 2024-07-12 ENCOUNTER — Telehealth: Payer: Self-pay | Admitting: Podiatry

## 2024-07-12 NOTE — Telephone Encounter (Signed)
 Recd [facility] form about surgery Faxed [phone] and no charge for form

## 2024-07-22 ENCOUNTER — Encounter: Admitting: Podiatry

## 2024-07-22 ENCOUNTER — Ambulatory Visit (INDEPENDENT_AMBULATORY_CARE_PROVIDER_SITE_OTHER): Admitting: Podiatry

## 2024-07-22 VITALS — BP 156/84 | HR 99 | Temp 97.3°F

## 2024-07-22 DIAGNOSIS — M21611 Bunion of right foot: Secondary | ICD-10-CM

## 2024-07-22 NOTE — Progress Notes (Signed)
 Subjective:   Patient ID: Heather Bryan, female   DOB: 58 y.o.   MRN: [id number]   HPI Patient is doing great seen by nurse today   ROS      Objective:  Physical Exam  Wound edges well coapted stitches intact     Assessment:  Doing well post pin removal right not having pain she did     Plan:  Stitch removal accomplished applied Band-Aid reappoint as symptoms indicate should have uneventful recovery

## 2024-07-22 NOTE — Progress Notes (Signed)
 Patient presents for post-op visit today, POV #1 DOS 07/08/2024 Pin Removal RT foot.   Doing good, had some oxycodone  5 mg at home and took that the first day and then 800 mg ibuprofen which has helped. Otherwise just a little pain. Returned to work last Thursday..  RN Notes: n/a  Vital Signs: Today's Vitals   07/22/24 0838  BP: (!) 156/84  Pulse: 99  Temp: (!) 97.3 F (36.3 C)  TempSrc: Oral  PainSc: 0-No pain  Patient is taking anti-hypertensive medications, recently increased to 300 mg.     Radiographs: []  Taken [x]  Not taken  Surgical Site Assessment:  - Dressing:  [x]  Minimal dry blood, intact []  Reinforced   []  Changed     -RN Notes: adhesive bandage in place.   - Incision:  [x]  CDI (clean, dry, intact)  []  Mild erythema  []  Drainage noted   -RN Notes: n/a  - Swelling:  []  None  [x]  Mild  []  Moderate   []  Significant     -RN Notes: n/a  - Bruising:  []  None  [x]  Present: plantar aspect of 1st toe.    - Sutures/Staples:  []  None [x]  Intact  [x]  Removed Today  []  Plan to remove at next visit   -Cast/Splint/Pins: [x]  None []  Intact []  Removed Today []  Plan to remove at next visit []  Replaced  -Signs of infection:  [x]  None  []  Present - Describe: n/a  -DME:    []  None []  AFW [x]  Surgical shoe []  Cast  []  Splint  -Walking status:  [x]  Full WB  []  Partial WB  []  NWB  -Utilizing device:  [x]  None []  Knee Scooter []  Crutches []  Wheelchair    DVT assessment:  [x]  Denies symptoms []  Chest pain/SOB []  Pain in calf/redness/warmth   Redressed DSD and ace wrap. Educated on signs of infection, proper dressing care, pain management, and weight bearing status. Patient will contact provider with any new or worsening symptoms. The provider assessed the patient today and reviewed instructions regarding plan of care.

## 2024-08-05 ENCOUNTER — Encounter
